# Patient Record
Sex: Female | Born: 1960 | Race: White | Hispanic: No | Marital: Single | Smoking: Never smoker
Health system: Southern US, Community
[De-identification: ages and names within clinical notes are randomized; demographics above are authoritative.]

## PROBLEM LIST (undated history)

## (undated) DIAGNOSIS — IMO0002 Reserved for concepts with insufficient information to code with codable children: Secondary | ICD-10-CM

## (undated) DIAGNOSIS — K589 Irritable bowel syndrome without diarrhea: Secondary | ICD-10-CM

## (undated) DIAGNOSIS — B009 Herpesviral infection, unspecified: Secondary | ICD-10-CM

## (undated) DIAGNOSIS — G522 Disorders of vagus nerve: Secondary | ICD-10-CM

## (undated) DIAGNOSIS — R87619 Unspecified abnormal cytological findings in specimens from cervix uteri: Secondary | ICD-10-CM

## (undated) DIAGNOSIS — R011 Cardiac murmur, unspecified: Secondary | ICD-10-CM

## (undated) DIAGNOSIS — Z9109 Other allergy status, other than to drugs and biological substances: Secondary | ICD-10-CM

## (undated) DIAGNOSIS — Z8739 Personal history of other diseases of the musculoskeletal system and connective tissue: Secondary | ICD-10-CM

## (undated) HISTORY — DX: Reserved for concepts with insufficient information to code with codable children: IMO0002

## (undated) HISTORY — DX: Irritable bowel syndrome, unspecified: K58.9

## (undated) HISTORY — PX: OTHER SURGICAL HISTORY: SHX169

## (undated) HISTORY — DX: Herpesviral infection, unspecified: B00.9

## (undated) HISTORY — DX: Personal history of other diseases of the musculoskeletal system and connective tissue: Z87.39

## (undated) HISTORY — DX: Disorders of vagus nerve: G52.2

## (undated) HISTORY — DX: Other allergy status, other than to drugs and biological substances: Z91.09

## (undated) HISTORY — DX: Unspecified abnormal cytological findings in specimens from cervix uteri: R87.619

## (undated) HISTORY — DX: Cardiac murmur, unspecified: R01.1

## (undated) HISTORY — PX: CHOLECYSTECTOMY: SHX55

---

## 1999-03-09 ENCOUNTER — Other Ambulatory Visit: Admission: RE | Admit: 1999-03-09 | Discharge: 1999-03-09 | Payer: Self-pay | Admitting: Obstetrics and Gynecology

## 2000-08-16 ENCOUNTER — Other Ambulatory Visit: Admission: RE | Admit: 2000-08-16 | Discharge: 2000-08-16 | Payer: Self-pay | Admitting: Obstetrics and Gynecology

## 2000-12-09 ENCOUNTER — Ambulatory Visit (HOSPITAL_COMMUNITY): Admission: RE | Admit: 2000-12-09 | Discharge: 2000-12-09 | Payer: Self-pay | Admitting: Orthopedic Surgery

## 2000-12-09 ENCOUNTER — Encounter: Payer: Self-pay | Admitting: Orthopedic Surgery

## 2001-05-09 ENCOUNTER — Encounter: Admission: RE | Admit: 2001-05-09 | Discharge: 2001-06-24 | Payer: Self-pay | Admitting: Orthopedic Surgery

## 2001-10-06 ENCOUNTER — Other Ambulatory Visit: Admission: RE | Admit: 2001-10-06 | Discharge: 2001-10-06 | Payer: Self-pay | Admitting: Obstetrics and Gynecology

## 2002-10-29 ENCOUNTER — Other Ambulatory Visit: Admission: RE | Admit: 2002-10-29 | Discharge: 2002-10-29 | Payer: Self-pay | Admitting: Obstetrics and Gynecology

## 2004-01-06 ENCOUNTER — Other Ambulatory Visit: Admission: RE | Admit: 2004-01-06 | Discharge: 2004-01-06 | Payer: Self-pay | Admitting: Obstetrics and Gynecology

## 2004-01-12 ENCOUNTER — Ambulatory Visit (HOSPITAL_COMMUNITY): Admission: RE | Admit: 2004-01-12 | Discharge: 2004-01-12 | Payer: Self-pay | Admitting: Obstetrics and Gynecology

## 2005-01-25 ENCOUNTER — Other Ambulatory Visit: Admission: RE | Admit: 2005-01-25 | Discharge: 2005-01-25 | Payer: Self-pay | Admitting: Urology

## 2005-04-11 ENCOUNTER — Encounter: Admission: RE | Admit: 2005-04-11 | Discharge: 2005-04-11 | Payer: Self-pay | Admitting: Family Medicine

## 2006-02-14 ENCOUNTER — Other Ambulatory Visit: Admission: RE | Admit: 2006-02-14 | Discharge: 2006-02-14 | Payer: Self-pay | Admitting: Obstetrics & Gynecology

## 2006-05-01 ENCOUNTER — Ambulatory Visit (HOSPITAL_COMMUNITY): Admission: RE | Admit: 2006-05-01 | Discharge: 2006-05-01 | Payer: Self-pay | Admitting: Obstetrics and Gynecology

## 2007-03-27 ENCOUNTER — Other Ambulatory Visit: Admission: RE | Admit: 2007-03-27 | Discharge: 2007-03-27 | Payer: Self-pay | Admitting: Obstetrics and Gynecology

## 2007-05-14 ENCOUNTER — Ambulatory Visit (HOSPITAL_COMMUNITY): Admission: RE | Admit: 2007-05-14 | Discharge: 2007-05-14 | Payer: Self-pay | Admitting: Obstetrics and Gynecology

## 2007-05-27 ENCOUNTER — Encounter: Admission: RE | Admit: 2007-05-27 | Discharge: 2007-05-27 | Payer: Self-pay | Admitting: Obstetrics and Gynecology

## 2008-03-09 ENCOUNTER — Encounter: Admission: RE | Admit: 2008-03-09 | Discharge: 2008-03-09 | Payer: Self-pay | Admitting: Gastroenterology

## 2008-05-26 ENCOUNTER — Other Ambulatory Visit: Admission: RE | Admit: 2008-05-26 | Discharge: 2008-05-26 | Payer: Self-pay | Admitting: Obstetrics & Gynecology

## 2008-08-23 ENCOUNTER — Ambulatory Visit (HOSPITAL_COMMUNITY): Admission: RE | Admit: 2008-08-23 | Discharge: 2008-08-23 | Payer: Self-pay | Admitting: Obstetrics and Gynecology

## 2010-01-18 ENCOUNTER — Encounter: Admission: RE | Admit: 2010-01-18 | Discharge: 2010-01-18 | Payer: Self-pay | Admitting: Obstetrics and Gynecology

## 2011-03-05 ENCOUNTER — Other Ambulatory Visit: Payer: Self-pay | Admitting: Obstetrics and Gynecology

## 2011-03-05 DIAGNOSIS — Z1231 Encounter for screening mammogram for malignant neoplasm of breast: Secondary | ICD-10-CM

## 2011-03-28 ENCOUNTER — Ambulatory Visit (HOSPITAL_COMMUNITY): Payer: Self-pay

## 2011-04-17 ENCOUNTER — Ambulatory Visit (HOSPITAL_COMMUNITY)
Admission: RE | Admit: 2011-04-17 | Discharge: 2011-04-17 | Disposition: A | Discharge status: Home / Self Care | Payer: BC Managed Care – PPO | Source: Ambulatory Visit | Attending: Obstetrics and Gynecology | Admitting: Obstetrics and Gynecology

## 2011-04-17 DIAGNOSIS — Z1231 Encounter for screening mammogram for malignant neoplasm of breast: Secondary | ICD-10-CM

## 2012-09-02 ENCOUNTER — Other Ambulatory Visit: Payer: Self-pay | Admitting: *Deleted

## 2012-09-02 DIAGNOSIS — Z780 Asymptomatic menopausal state: Secondary | ICD-10-CM

## 2012-09-10 ENCOUNTER — Encounter: Payer: Self-pay | Admitting: *Deleted

## 2012-09-12 ENCOUNTER — Ambulatory Visit: Payer: BC Managed Care – PPO

## 2012-09-12 DIAGNOSIS — Z780 Asymptomatic menopausal state: Secondary | ICD-10-CM

## 2012-09-12 LAB — FOLLICLE STIMULATING HORMONE: FSH: 40.6 m[IU]/mL

## 2012-09-23 ENCOUNTER — Telehealth: Payer: Self-pay | Admitting: Nurse Practitioner

## 2012-09-23 NOTE — Telephone Encounter (Signed)
Patient was called back, she had been OOT.  States she has some vaso symptoms, but for the most part are tolerable.  She had a withdraw bleed after stopping OCP in March and has not had menses since.  She is comfortable with watchful waiting and keep menses record.  If symptoms are bad can try OTC Estroven.  She is going OOT to trade show for next 3 months and after return will call back.  She might consider HRT and is instructed that she will need OV to discuss.

## 2014-11-25 ENCOUNTER — Ambulatory Visit (INDEPENDENT_AMBULATORY_CARE_PROVIDER_SITE_OTHER): Payer: BLUE CROSS/BLUE SHIELD | Admitting: Internal Medicine

## 2014-11-25 ENCOUNTER — Ambulatory Visit (INDEPENDENT_AMBULATORY_CARE_PROVIDER_SITE_OTHER)
Admission: RE | Admit: 2014-11-25 | Discharge: 2014-11-25 | Disposition: A | Discharge status: Home / Self Care | Payer: BLUE CROSS/BLUE SHIELD | Source: Ambulatory Visit | Attending: Internal Medicine | Admitting: Internal Medicine

## 2014-11-25 ENCOUNTER — Encounter: Payer: Self-pay | Admitting: Internal Medicine

## 2014-11-25 ENCOUNTER — Encounter (INDEPENDENT_AMBULATORY_CARE_PROVIDER_SITE_OTHER): Payer: Self-pay

## 2014-11-25 VITALS — BP 134/80 | HR 90 | Ht 63.0 in | Wt 185.0 lb

## 2014-11-25 DIAGNOSIS — J45991 Cough variant asthma: Secondary | ICD-10-CM

## 2014-11-25 DIAGNOSIS — R05 Cough: Secondary | ICD-10-CM

## 2014-11-25 DIAGNOSIS — R058 Other specified cough: Secondary | ICD-10-CM

## 2014-11-25 MED ORDER — TRAMADOL HCL 50 MG PO TABS: ORAL_TABLET | ORAL | Status: DC

## 2014-11-25 MED ORDER — FLUTTER DEVI: Status: DC

## 2014-11-25 NOTE — Patient Instructions (Addendum)
Stop advair and start new maintenance rx = dulera 100 Take 2 puffs first thing in am and then another 2 puffs about 12 hours later.   Work on Musician technique:  relax and gently blow all the way out then take a nice smooth deep breath back in, triggering the inhaler at same time you start breathing in.  Hold for up to 5 seconds if you can.  Rinse and gargle with water when done  For cough as needed  mucinex dm 1200 mg every 12 hours and flutter valve  - if still still coughing then add tramadol 50 mg 1-2 hours every 12 hours   For breathing as needed > Only use your albuterol as a rescue medication to be used if you can't catch your breath by resting or doing a relaxed purse lip breathing pattern.  - The less you use it, the better it will work when you need it. - Ok to use up to 2 puffs  every 4 hours if you must but call for immediate appointment if use goes up over your usual need - Don't leave home without it !!  (think of it like the spare tire for your car)   Try prilosec 20mg   Take 30-60 min before first meal of the day and Pepcid (famotidine) 20 mg one bedtime until cough is completely gone for at least a week without the need for cough suppression then stop it     GERD (REFLUX)  is an extremely common cause of respiratory symptoms just like yours , many times with no obvious heartburn at all.    It can be treated with medication, but also with lifestyle changes including avoidance of late meals, elevation of the head of your bed (ideally with 6 inch  bed blocks) excessive alcohol, smoking cessation, and avoid fatty foods, chocolate, peppermint, colas, red wine, and acidic juices such as orange juice.  NO MINT OR MENTHOL PRODUCTS SO NO COUGH DROPS  USE SUGARLESS CANDY INSTEAD (Jolley ranchers or Stover's or Life Savers) or even ice chips will also do - the key is to swallow to prevent all throat clearing. NO OIL BASED VITAMINS - use powdered substitutes.   Please see  patient coordinator before you leave today  to schedule sinus ct  Please remember to go to the  x-ray department downstairs for your tests - we will call you with the results when they are available.  Please schedule a follow up office visit in 2 weeks, sooner if needed

## 2014-11-25 NOTE — Progress Notes (Signed)
Subjective:    Patient ID: Cassidy Walton, female    DOB: [birth year], 54 y.o.   MRN: [id number]  HPI  37 yowf never regular smoker with fall = spring allergies eval by Dr [name] at this clinic mold rec shots declined and just used otc's then asthma problems mid 30's rx with prn saba rarely needed but seemed to trigger with sinus problems that recurred early May 2016 and using every 4 hours since despite rx with zpak/ pred so referred by [name] to pulmonary clinic p newly started on Advair 11/22/14 plus prednisone   11/25/2014 1st [facility] office visit/ Oliverio Cho   Chief Complaint  Patient presents with  . Pulmonary Consult    Referred by Dr. [name]. Pt states dxed with Asthma over 20 yrs ago.  She c/o cough for the past 3-4 wks. Cough was prod a few wks ago, but not for the past wk or so. Cough is much worse when she lies down- has to sleep sitting up.  She is using albuterol approx 6 x per day.   this am took advair and last dose of albterol 6 h prior to ov/ best she's felt was p [facility] rx 6/8 with neb  All this started with sinus flare "like it always dose"  Cough >> sob and coughs so hard hurts over abd/ occ urinary incont   No obvious  patterns in day to day or daytime variabilty or assoc  cp or chest tightness, subjective wheeze overt sinus or hb symptoms. No unusual exp hx or h/o childhood pna/ asthma or knowledge of premature birth.   . Also denies any obvious fluctuation of symptoms with weather or environmental changes or other aggravating or alleviating factors except as outlined above   Current Medications, Allergies, Complete Past Medical History, Past Surgical History, Family History, and Social History were reviewed in [employer] record.             Review of Systems  Constitutional: Negative for fever, chills and unexpected weight change.  HENT: Negative for congestion, dental problem, ear pain, nosebleeds, postnasal drip, rhinorrhea, sinus  pressure, sneezing, sore throat, trouble swallowing and voice change.   Eyes: Negative for visual disturbance.  Respiratory: Positive for cough. Negative for choking and shortness of breath.   Cardiovascular: Negative for chest pain and leg swelling.  Gastrointestinal: Positive for abdominal pain. Negative for vomiting and diarrhea.  Genitourinary: Negative for difficulty urinating.  Musculoskeletal: Negative for arthralgias.  Skin: Negative for rash.  Neurological: Positive for headaches. Negative for tremors and syncope.  Hematological: Does not bruise/bleed easily.       Objective:   Physical Exam  amb wf with mild pseudowheeze /very talkative/ nad  Wt Readings from Last 3 Encounters:  11/25/14 185 lb (83.915 kg)    Vital signs reviewed     HEENT: nl dentition, turbinates, and orophanx. Nl external ear canals without cough reflex   NECK :  without JVD/Nodes/TM/ nl carotid upstrokes bilaterally   LUNGS: no acc muscle use, clear to A and P bilaterally without cough on insp or exp maneuvers   CV:  RRR  no s3 or murmur or increase in P2, no edema   ABD:  soft and nontender with nl excursion in the supine position. No bruits or organomegaly, bowel sounds nl  MS:  warm without deformities, calf tenderness, cyanosis or clubbing  SKIN: warm and dry without lesions    NEURO:  alert, approp, no deficits  CXR PA and Lateral:   11/25/2014 :     I personally reviewed images and agree with radiology impression as follows:     Mild/mod kyphosis, no acute changes      Assessment & Plan:

## 2014-11-26 ENCOUNTER — Encounter: Payer: Self-pay | Admitting: Internal Medicine

## 2014-11-26 DIAGNOSIS — J45991 Cough variant asthma: Secondary | ICD-10-CM | POA: Insufficient documentation

## 2014-11-26 NOTE — Progress Notes (Signed)
Quick Note:  Spoke with pt and notified of results per Dr. [name]. Pt verbalized understanding and denied any questions.  ______ 

## 2014-11-26 NOTE — Assessment & Plan Note (Signed)
The most common causes of chronic cough in immunocompetent adults include the following: upper airway cough syndrome (UACS), previously referred to as postnasal drip syndrome (PNDS), which is caused by variety of rhinosinus conditions; (2) asthma; (3) GERD; (4) chronic bronchitis from cigarette smoking or other inhaled environmental irritants; (5) nonasthmatic eosinophilic bronchitis; and (6) bronchiectasis.   These conditions, singly or in combination, have accounted for up to 94% of the causes of chronic cough in prospective studies.   Other conditions have constituted no >6% of the causes in prospective studies These have included bronchogenic carcinoma, chronic interstitial pneumonia, sarcoidosis, left ventricular failure, ACEI-induced cough, and aspiration from a condition associated with pharyngeal dysfunction.    Chronic cough is often simultaneously caused by more than one condition. A single cause has been found from 38 to 82% of the time, multiple causes from 18 to 62%. Multiply caused cough has been the result of three diseases up to 42% of the time.       Based on hx and exam, this is most likely:  Classic Upper airway cough syndrome, so named because it's frequently impossible to sort out how much is  CR/sinusitis with freq throat clearing (which can be related to primary GERD)   vs  causing  secondary (" extra esophageal")  GERD from wide swings in gastric pressure that occur with throat clearing, often  promoting self use of mint and menthol lozenges that reduce the lower esophageal sphincter tone and exacerbate the problem further in a cyclical fashion.   These are the same pts (now being labeled as having "irritable larynx syndrome" by some cough centers) who not infrequently have a history of having failed to tolerate ace inhibitors,  dry powder inhalers or biphosphonates or report having atypical reflux symptoms that don't respond to standard doses of PPI , and are easily confused as  having aecopd or asthma flares by even experienced allergists/ pulmonologists.   The first step is to maximize acid suppression and eliminate cyclical coughing  Plus eval for underlying sinus dz then regroup.  See instructions for specific recommendations which were reviewed directly with the patient who was given a copy with highlighter outlining the key components.

## 2014-11-26 NOTE — Assessment & Plan Note (Signed)
Very difficult to tell how much of her problem is asthma though cough have either a sinobronchial reflex or a reflex to reflux either one driving reactive airwasy dz and that's why she felt better p neb and prednisone  rec trial of dulera 100 2bid instead of dpi advair as she has a large component of uacs active here  The proper method of use, as well as anticipated side effects, of a metered-dose inhaler are discussed and demonstrated to the patient. Improved effectiveness after extensive coaching during this visit to a level of approximately  75%

## 2014-12-01 ENCOUNTER — Ambulatory Visit (INDEPENDENT_AMBULATORY_CARE_PROVIDER_SITE_OTHER)
Admission: RE | Admit: 2014-12-01 | Discharge: 2014-12-01 | Disposition: A | Discharge status: Home / Self Care | Payer: BLUE CROSS/BLUE SHIELD | Source: Ambulatory Visit | Attending: Internal Medicine | Admitting: Internal Medicine

## 2014-12-01 DIAGNOSIS — R05 Cough: Secondary | ICD-10-CM

## 2014-12-01 DIAGNOSIS — R058 Other specified cough: Secondary | ICD-10-CM

## 2014-12-01 NOTE — Progress Notes (Signed)
Quick Note:  LVM for patient to return call. ______ 

## 2014-12-01 NOTE — Progress Notes (Signed)
Quick Note:  Pt returned call. Reviewed results and recs. Pt aware of cough plan per last OV. Pt voiced understanding and had no further questions. ______

## 2014-12-10 ENCOUNTER — Encounter: Payer: Self-pay | Admitting: Internal Medicine

## 2014-12-10 ENCOUNTER — Ambulatory Visit (INDEPENDENT_AMBULATORY_CARE_PROVIDER_SITE_OTHER): Payer: BLUE CROSS/BLUE SHIELD | Admitting: Internal Medicine

## 2014-12-10 VITALS — BP 130/90 | HR 72 | Ht 60.0 in | Wt 186.0 lb

## 2014-12-10 DIAGNOSIS — J45991 Cough variant asthma: Secondary | ICD-10-CM

## 2014-12-10 DIAGNOSIS — R05 Cough: Secondary | ICD-10-CM

## 2014-12-10 DIAGNOSIS — R058 Other specified cough: Secondary | ICD-10-CM

## 2014-12-10 MED ORDER — MOMETASONE FURO-FORMOTEROL FUM 100-5 MCG/ACT IN AERO: 2.0000 | INHALATION_SPRAY | Freq: Two times a day (BID) | RESPIRATORY_TRACT | Status: DC

## 2014-12-10 NOTE — Progress Notes (Signed)
Subjective:    Patient ID: Cassidy Walton, female    DOB: 11-30-1960   MRN: [id number]    Brief patient profile:  62 yowf never regular smoker with fall = spring allergies eval by Dr [name] at this clinic mold rec shots declined and just used otc's then asthma problems mid 30's rx with prn saba rarely needed but seemed to trigger with sinus problems that recurred early May 2016 and using every 4 hours since despite rx with zpak/ pred so referred by [name] to pulmonary clinic p newly started on Advair 11/22/14 plus prednisone   History of Present Illness  11/25/2014 1st [facility] office visit/ Cassidy Walton   Chief Complaint  Patient presents with  . Pulmonary Consult    Referred by Dr. [name]. Pt states dxed with Asthma over 20 yrs ago.  She c/o cough for the past 3-4 wks. Cough was prod a few wks ago, but not for the past wk or so. Cough is much worse when she lies down- has to sleep sitting up.  She is using albuterol approx 6 x per day.   this am took advair and last dose of albterol 6 h prior to ov/ best she's felt was p [facility] rx 6/8 with neb  All this started with sinus flare "like it always dose"  Cough >> sob and coughs so hard hurts over abd/ occ urinary incont  rec Stop advair and start new maintenance rx = dulera 100 Take 2 puffs first thing in am and then another 2 puffs about 12 hours later.  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ist, commercial:   For cough as needed  mucinex dm 1200 mg every 12 hours and flutter valve  - if still still coughing then add tramadol 50 mg 1-2 hours every 12 hours For breathing as needed > Only use your albuterol as a rescue medication  Try prilosec 20mg   Take 30-60 min before first meal of the day and Pepcid (famotidine) 20 mg one bedtime until cough is completely gone for at least a week without the need for cough suppression then stop it  GERD diet   schedule sinus ct> neg   12/10/2014 f/u ov/Cassidy Walton re: severe cough / maint on gerdr rx adn dulera 1002 bid/  not sure how to use flutter Chief Complaint  Patient presents with  . Follow-up    Cough is much improved since her last visit.     Little hacking cough spring / fall all her life and feels back to baseline/ seems worse first thing in am  No obvious day to day or daytime variabilty or assoc sob or cp or chest tightness, subjective wheeze overt sinus or hb symptoms. No unusual exp hx or h/o childhood pna/ asthma or knowledge of premature birth.  Sleeping ok without nocturnal  or early am exacerbation  of respiratory  c/o's or need for noct saba. Also denies any obvious fluctuation of symptoms with weather or environmental changes or other aggravating or alleviating factors except as outlined above   Current Medications, Allergies, Complete Past Medical History, Past Surgical History, Family History, and Social History were reviewed in [employer] record.  ROS  The following are not active complaints unless bolded sore throat, dysphagia, dental problems, itching, sneezing,  nasal congestion or excess/ purulent secretions, ear ache,   fever, chills, sweats, unintended wt loss, pleuritic or exertional cp, hemoptysis,  orthopnea pnd or leg swelling, presyncope, palpitations, abdominal pain, anorexia, nausea, vomiting, diarrhea  or change in bowel or urinary habits, change in stools or urine, dysuria,hematuria,  rash, arthralgias, visual complaints, headache, numbness weakness or ataxia or problems with walking or coordination,  change in mood/affect or memory.            Objective:   Physical Exam  amb wf with nad    . Wt Readings from Last 3 Encounters:  12/10/14 186 lb (84.369 kg)  11/25/14 185 lb (83.915 kg)    Vital signs reviewed      HEENT: nl dentition, turbinates, and orophanx. Nl external ear canals without cough reflex   NECK :  without JVD/Nodes/TM/ nl carotid upstrokes bilaterally   LUNGS: no acc muscle use, clear to A and P bilaterally without  cough on insp or exp maneuvers   CV:  RRR  no s3 or murmur or increase in P2, no edema   ABD:  soft and nontender with nl excursion in the supine position. No bruits or organomegaly, bowel sounds nl  MS:  warm without deformities, calf tenderness, cyanosis or clubbing  SKIN: warm and dry without lesions    NEURO:  alert, approp, no deficits     CXR PA and Lateral:   11/25/2014 :     I personally reviewed images and agree with radiology impression as follows:     Mild/mod kyphosis, no acute changes      Assessment & Plan:

## 2014-12-10 NOTE — Patient Instructions (Addendum)
Try to leave off the tramadol  Continue dulera 100 Take 2 puffs first thing in am and then another 2 puffs about 12 hours later  Continue  prilosec 20mg   Take 30-60 min before first meal of the day and Pepcid (famotidine) 20 mg one bedtime and chlortrimeton 4 mg 1-2 at bedtime to see what effect it has on your hacky am cough   For drainage take chlortrimeton (chlorpheniramine) 4 mg every 4 hours available over the counter (may cause drowsiness)   Please schedule a follow up office visit in 4 weeks, sooner if needed

## 2014-12-12 ENCOUNTER — Encounter: Payer: Self-pay | Admitting: Internal Medicine

## 2014-12-12 NOTE — Assessment & Plan Note (Signed)
-  11/25/2014 p extensive coaching HFA effectiveness =    75% > try dulera 100 and stop advair   All goals of chronic asthma control met including optimal function and elimination of symptoms with minimal need for rescue therapy.  Contingencies discussed in full including contacting this office immediately if not controlling the symptoms using the rule of two's.

## 2014-12-12 NOTE — Assessment & Plan Note (Signed)
-   sinus ct 12/01/2014 > Mild mucosal edema in the maxillary sinus bilaterally. Remaining sinuses are clear. No air-fluid level  Marked improvement off advair and on gerd rx - challenge will be to see if cough cycles back up in intensity since not completely gone "all her life"  rec trial of add on 1st gen H1 per guidelines  I had an extended discussion with the patient reviewing all relevant studies completed to date and  lasting 15 to 20 minutes of a 25 minute visit on the following ongoing concerns: The standardized cough guidelines published in Chest by [place] in 2006 are still the best available and consist of a multiple step process (up to 12!) , not a single office visit,  and are intended  to address this problem logically,  with an alogrithm dependent on response to empiric treatment at  each progressive step  to determine a specific diagnosis with  minimal addtional testing needed. Therefore if adherence is an issue or can't be accurately verified,  it's very unlikely the standard evaluation and treatment will be successful here.   Each maintenance medication was reviewed in detail including most importantly the difference between maintenance and as needed and under what circumstances the prns are to be used.  Please see instructions for details which were reviewed in writing and the patient given a copy.     Furthermore, response to therapy (other than acute cough suppression, which should only be used short term with avoidance of narcotic containing cough syrups if possible), can be a gradual process for which the patient may perceive immediate benefit.  Unlike going to an eye doctor where the best perscription is almost always the first one and is immediately effective, this is almost never the case in the management of chronic cough syndromes. Therefore the patient needs to commit up front to consistently adhere to recommendations  for up to 6 weeks of therapy directed at the likely  underlying problem(s) before the response can be reasonably evaluated.

## 2015-01-04 ENCOUNTER — Telehealth: Payer: Self-pay | Admitting: Internal Medicine

## 2015-01-04 NOTE — Telephone Encounter (Signed)
Patient seen in office 12/10/14 - change in regimen from Mucinex BID to Chlortrimeton q4hr Patient states that she is taking all other meds as directed (Dulera, Prilosec, and Pepcid) Pt states that she has started back on Mucinex BID d/t increased cough and congestion - pt has been working outside with her job this week and states that the weather has got her breathing upset. Wants to know if she needs to be taking the Mucinex and Chlortrimeton together? Is it safe to take Mucinex BID and in addtion take Chlortrimeton q4hr? Patient Instructions 12/10/14    Try to leave off the tramadol  Continue dulera 100 Take 2 puffs first thing in am and then another 2 puffs about 12 hours later  Continue prilosec 20mg  Take 30-60 min before first meal of the day and Pepcid (famotidine) 20 mg one bedtime and chlortrimeton 4 mg 1-2 at bedtime to see what effect it has on your hacky am cough   For drainage take chlortrimeton (chlorpheniramine) 4 mg every 4 hours available over the counter (may cause drowsiness)   Please schedule a follow up office visit in 4 weeks, sooner if needed    Please advise Dr Sherene SiresWert. Thanks.

## 2015-01-04 NOTE — Telephone Encounter (Signed)
Called made pt aware of recs.nothing further needed

## 2015-01-04 NOTE — Telephone Encounter (Signed)
For drainage take chlortrimeton (chlorpheniramine) 4 mg every 4 hours available over the counter (may cause drowsiness)   For cough take mucinex dm up to 1200 mg every 12 hours and if not better ov with Tammy or me with all meds including otcs in hand

## 2015-01-12 ENCOUNTER — Ambulatory Visit (INDEPENDENT_AMBULATORY_CARE_PROVIDER_SITE_OTHER): Payer: BLUE CROSS/BLUE SHIELD | Admitting: Internal Medicine

## 2015-01-12 ENCOUNTER — Other Ambulatory Visit (INDEPENDENT_AMBULATORY_CARE_PROVIDER_SITE_OTHER): Payer: BLUE CROSS/BLUE SHIELD

## 2015-01-12 ENCOUNTER — Encounter: Payer: Self-pay | Admitting: Internal Medicine

## 2015-01-12 VITALS — BP 150/70 | HR 88 | Wt 184.0 lb

## 2015-01-12 DIAGNOSIS — J45991 Cough variant asthma: Secondary | ICD-10-CM

## 2015-01-12 DIAGNOSIS — R058 Other specified cough: Secondary | ICD-10-CM

## 2015-01-12 DIAGNOSIS — R05 Cough: Secondary | ICD-10-CM

## 2015-01-12 LAB — CBC WITH DIFFERENTIAL/PLATELET
BASOS ABS: 0 10*3/uL (ref 0.0–0.1)
BASOS PCT: 0.3 % (ref 0.0–3.0)
EOS PCT: 8.5 % — AB (ref 0.0–5.0)
Eosinophils Absolute: 0.7 10*3/uL (ref 0.0–0.7)
HEMATOCRIT: 42.6 % (ref 36.0–46.0)
Hemoglobin: 14.3 g/dL (ref 12.0–15.0)
Lymphocytes Relative: 23.8 % (ref 12.0–46.0)
Lymphs Abs: 1.9 10*3/uL (ref 0.7–4.0)
MCHC: 33.6 g/dL (ref 30.0–36.0)
MCV: 90 fl (ref 78.0–100.0)
MONO ABS: 0.5 10*3/uL (ref 0.1–1.0)
MONOS PCT: 6.7 % (ref 3.0–12.0)
Neutro Abs: 4.9 10*3/uL (ref 1.4–7.7)
Neutrophils Relative %: 60.7 % (ref 43.0–77.0)
Platelets: 216 10*3/uL (ref 150.0–400.0)
RBC: 4.74 Mil/uL (ref 3.87–5.11)
RDW: 13.3 % (ref 11.5–15.5)
WBC: 8.1 10*3/uL (ref 4.0–10.5)

## 2015-01-12 NOTE — Patient Instructions (Addendum)
Dulera 100 Take 2 puffs first thing in am and then another 2 puffs about 12 hours later.   Try prilosec otc   Take 30-60 min before first meal of the day and Pepcid ac (famotidine) 20 mg one @  bedtime until cough is completely gone for at least a week without the need for cough suppression and your voice is completely normal   Only use your albuterol as a rescue medication to be used up to every 4 hours  If needed for cough/ wheeze/ short of breath  - The less you use it, the better it will work when you need it.  - Don't leave home without it !!  (think of it like the spare tire for your car)   For drainage take chlortrimeton (chlorpheniramine) 4 mg every 4 hours available over the counter (may cause drowsiness)   For cough mucinex dm up to 1200 mg every 12 hours as needed and use the flutter valve to prevent airway trauma  Please remember to go to the lab department downstairs for your tests - we will call you with the results when they are available.  If not doing well as you desire > See Tammy NP w/in 2 weeks with all your medications, even over the counter meds, and flutter valve/ separated in two separate bags, the ones you take no matter what vs the ones you stop once you feel better and take only as needed when you feel you need them.   Tammy  will generate for you a new user friendly medication calendar that will put [place] all on the same page re: your medication use.     Without this process, it simply isn't possible to assure that we are providing  your outpatient care  with  the attention to detail we feel you deserve.   If we cannot assure that you're getting that kind of care,  then we cannot manage your problem effectively from this clinic.  Once you have seen Tammy and we are sure that we're all on the same page with your medication use she will arrange follow up with me.

## 2015-01-12 NOTE — Progress Notes (Signed)
Subjective:    Patient ID: Cassidy Walton, female    DOB: [birth year]   MRN: [id number]    Brief patient profile:  53 yowf never regular smoker with fall = spring allergies eval by Dr [name] at this clinic mold rec shots declined and just used otc's then asthma problems mid 30's rx with prn saba rarely needed but seemed to trigger with sinus problems that recurred early May 2016 and using every 4 hours since despite rx with zpak/ pred so referred by [name] to pulmonary clinic p newly started on Advair 11/22/14 plus prednisone   History of Present Illness  11/25/2014 1st [facility] office visit/ [name]   Chief Complaint  Patient presents with  . Pulmonary Consult    Referred by Dr. [name]. Pt states dxed with Asthma over 20 yrs ago.  She c/o cough for the past 3-4 wks. Cough was prod a few wks ago, but not for the past wk or so. Cough is much worse when she lies down- has to sleep sitting up.  She is using albuterol approx 6 x per day.   this am took advair and last dose of albterol 6 h prior to ov/ best she's felt was p Gates rx 6/8 with neb  All this started with sinus flare "like it always dose"  Cough >> sob and coughs so hard hurts over abd/ occ urinary incont  rec Stop advair and start new maintenance rx = dulera 100 Take 2 puffs first thing in am and then another 2 puffs about 12 hours later.  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, commercial:   For cough as needed  mucinex dm 1200 mg every 12 hours and flutter valve  - if still still coughing then add tramadol 50 mg 1-2 hours every 12 hours For breathing as needed > Only use your albuterol as a rescue medication  Try prilosec   Take 30-60 min before first meal of the day and Pepcid (famotidine) 20 mg one bedtime until cough is completely gone for at least a week without the need for cough suppression then stop it  GERD diet   schedule sinus ct> neg   12/10/2014 f/u ov/[name] re: severe cough / maint on gerdr rx and dulera 100 2 bid/  not sure how to use flutter Chief Complaint  Patient presents with  . Follow-up    Cough is much improved since her last visit.    Little hacking cough spring / fall all her life and feels back to baseline/ seems worse first thing in am rec Try to leave off the tramadol Continue dulera 100 Take 2 puffs first thing in am and then another 2 puffs about 12 hours later Continue  prilosec   Take 30-60 min before first meal of the day and Pepcid (famotidine) 20 mg one bedtime and chlortrimeton 4 mg 1-2 at bedtime to see what effect it has on your hacky am cough  For drainage take chlortrimeton (chlorpheniramine) 4 mg every 4 hours available over the counter (may cause drowsiness)    01/12/2015 f/u ov/[name] re: recurrent cough  Chief Complaint  Patient presents with  . Follow-up    Pt states her cough has been worse for the past 2 wks- relates to working outside in the heat. Cough is prod with tan sputum.      Was better, now worse with cough more day than noct, more outdoors than indoors / not really clear what / how she takes her  meds   No obvious day to day or daytime variabilty or assoc sob or cp or chest tightness, subjective wheeze overt sinus or hb symptoms. No unusual exp hx or h/o childhood pna/ asthma or knowledge of premature birth.  Sleeping ok without nocturnal  or early am exacerbation  of respiratory  c/o's or need for noct saba. Also denies any obvious fluctuation of symptoms with weather or environmental changes or other aggravating or alleviating factors except as outlined above   Current Medications, Allergies, Complete Past Medical History, Past Surgical History, Family History, and Social History were reviewed in [employer] record.  ROS  The following are not active complaints unless bolded sore throat, dysphagia, dental problems, itching, sneezing,  nasal congestion or excess/ purulent secretions, ear ache,   fever, chills, sweats, unintended wt  loss, pleuritic or exertional cp, hemoptysis,  orthopnea pnd or leg swelling, presyncope, palpitations, abdominal pain, anorexia, nausea, vomiting, diarrhea  or change in bowel or urinary habits, change in stools or urine, dysuria,hematuria,  rash, arthralgias, visual complaints, headache, numbness weakness or ataxia or problems with walking or coordination,  change in mood/affect or memory.            Objective:   Physical Exam  amb wf with nad - slt voice fatigue    01/12/2015        184  Wt Readings from Last 3 Encounters:  12/10/14 186 lb (84.369 kg)  11/25/14 185 lb (83.915 kg)    Vital signs reviewed      HEENT: nl dentition, turbinates, and orophanx. Nl external ear canals without cough reflex   NECK :  without JVD/Nodes/TM/ nl carotid upstrokes bilaterally   LUNGS: no acc muscle use, clear to A and P bilaterally without cough on insp or exp maneuvers   CV:  RRR  no s3 or murmur or increase in P2, no edema   ABD:  soft and nontender with nl excursion in the supine position. No bruits or organomegaly, bowel sounds nl  MS:  warm without deformities, calf tenderness, cyanosis or clubbing  SKIN: warm and dry without lesions    NEURO:  alert, approp, no deficits     CXR PA and Lateral:   11/25/2014 :     I personally reviewed images and agree with radiology impression as follows:    Mild/mod kyphosis, no acute changes    Labs ordered 01/12/15 allergy profile/ cbc with diff      Assessment & Plan:

## 2015-01-13 LAB — ALLERGY FULL PROFILE
ALLERGEN, D PTERNOYSSINUS, D1: 0.14 kU/L — AB
Allergen,Goose feathers, e70: <0.1 kU/L
Alternaria Alternata: <0.1 kU/L
Aspergillus fumigatus, m3: <0.1 kU/L
Bermuda Grass: <0.1 kU/L
Box Elder IgE: <0.1 kU/L
Candida Albicans: <0.1 kU/L
Cat Dander: <0.1 kU/L
Curvularia lunata: <0.1 kU/L
D. farinae: 0.12 kU/L — ABNORMAL HIGH
Elm IgE: <0.1 kU/L
Fescue: <0.1 kU/L
G005 Rye, Perennial: <0.1 kU/L
Helminthosporium halodes: <0.1 kU/L
IgE (Immunoglobulin E), Serum: 27 kU/L (ref <115)
Lamb's Quarters: <0.1 kU/L
Plantain: <0.1 kU/L
Sycamore Tree: <0.1 kU/L

## 2015-01-14 ENCOUNTER — Telehealth: Payer: Self-pay | Admitting: *Deleted

## 2015-01-14 NOTE — Telephone Encounter (Signed)
-----   Message from Nyoka Cowden, MD sent at 01/13/2015  7:59 PM EDT -----   ----- Message -----    From: Nyoka Cowden, MD    Sent: 01/13/2015   7:56 PM      To: Nyoka Cowden, MD  I made error on result and don't know how to ammend it but it should say mild dust, not mold allergy

## 2015-01-14 NOTE — Telephone Encounter (Signed)
Spoke with pt and notified of results per Dr. [name]. Pt verbalized understanding and denied any questions. 

## 2015-01-14 NOTE — Progress Notes (Signed)
Quick Note:  See phone note dated 01/14/15 ______

## 2015-01-18 ENCOUNTER — Encounter: Payer: Self-pay | Admitting: Internal Medicine

## 2015-01-18 NOTE — Assessment & Plan Note (Signed)
-   11/25/2014 p extensive coaching HFA effectiveness =    75% > try dulera 100 and stop advair   The proper method of use, as well as anticipated side effects, of a metered-dose inhaler are discussed and demonstrated to the patient. Improved effectiveness after extensive coaching during this visit to a level of approximately  75% but no better  dulera is probably the best choice to avoid aggravating uacs but needs to first perfect optimal hfa technique  I had an extended discussion with the patient reviewing all relevant studies completed to date and  lasting 15 to 20 minutes of a 25 minute visit    Each maintenance medication was reviewed in detail including most importantly the difference between maintenance and prns and under what circumstances the prns are to be triggered using an action plan format that is not reflected in the computer generated alphabetically organized AVS.    Please see instructions for details which were reviewed in writing and the patient given a copy highlighting the part that I personally wrote and discussed at today's ov.

## 2015-01-18 NOTE — Assessment & Plan Note (Signed)
-   sinus ct 12/01/2014 > Mild mucosal edema in the maxillary sinus bilaterally. Remaining sinuses are clear. No air-fluid level - Allergy profile 01/12/15 > Eos 0.7/  IgE 27, min pos mold on RAST  Says this is a lifelong problem so unlikely to completely eliminate it at this point.  However, if she wants to work with [place] she'll have to meet [place] half way and understand:  Unlike when you get a prescription for eyeglasses, it's not possible to always walk out of this or any medical office with a perfect prescription that is immediately effective  based on any test that we offer here.    On the contrary, it may take several weeks for the full impact of changes recommened today - hopefully you will respond well.  If not, then we'll adjust your medication on your next visit accordingly, knowing more then than we can possibly know now.      First step then is to try implementing rx as prev rec but  return for full med reconciliation and go from there.

## 2015-01-25 ENCOUNTER — Telehealth: Payer: Self-pay | Admitting: Internal Medicine

## 2015-01-25 NOTE — Telephone Encounter (Signed)
Fine with me to f/u prn but if needs to return to see me needs to bring all active meds with her to confirm what it is she is actively taking before considering any change in rx

## 2015-01-25 NOTE — Telephone Encounter (Signed)
Patient notified.  Nothing further needed. 

## 2015-01-25 NOTE — Telephone Encounter (Signed)
Patient still has some cough, not as bad as it was.  Patient is still using flutter device daily.  Patient does not understand why she needs to come in to have a medication calendar done.     Patient says that she is taking all the medication that she was told to take. The only medication she is not taking is the Mucinex but she keeps it on hand if she needs it. Patient is leaving to go out of town on 8/16 and will be gone for 3 weeks.  FYI to Dr. Sherene Sires

## 2015-03-02 ENCOUNTER — Telehealth: Payer: Self-pay | Admitting: Internal Medicine

## 2015-03-02 NOTE — Telephone Encounter (Signed)
Received PA form from [facility] at [phone] Pt ID number [id number]  Was advised to call [phone]- long hold time will have to try again later

## 2015-03-02 NOTE — Telephone Encounter (Signed)
Spoke with the pt  She states needing PA done on Dulera  [facility] called [facility] [place] and req fax  Will hold in my basket

## 2015-03-03 NOTE — Telephone Encounter (Signed)
MW pt needs PA on dulera.  Pt last seen 01/12/15 and told to f/u with TP for med calendar then MW--not done. Do you want to continue with PA?

## 2015-03-03 NOTE — Telephone Encounter (Signed)
Attempted to contact patient, line busy. wcb 

## 2015-03-03 NOTE — Telephone Encounter (Signed)
Given her a 2 week sample and do the f/u as planned as we can probably get her something better then (symbicort has a 0 copay guarantee for example)

## 2015-03-04 NOTE — Telephone Encounter (Signed)
lmtcb

## 2015-03-07 MED ORDER — MOMETASONE FURO-FORMOTEROL FUM 100-5 MCG/ACT IN AERO: 2.0000 | INHALATION_SPRAY | Freq: Two times a day (BID) | RESPIRATORY_TRACT | Status: DC

## 2015-03-07 NOTE — Telephone Encounter (Signed)
lmtcb X2 for pt to make her aware of below recs.   Samples of dulera placed up front for pt.

## 2015-03-07 NOTE — Telephone Encounter (Signed)
Pt returned call Pt can not understand any messages on home phone. If pt needs an appt, please call and leave that on VM. [phone] or call [phone] (home)

## 2015-03-07 NOTE — Telephone Encounter (Signed)
Cassidy Walton from [facility] returned call about [place] PA [phone]

## 2015-03-07 NOTE — Telephone Encounter (Signed)
Spoke with pt. She agrees with the sample and ROV. Sample has been left at the front desk. ROV has been scheduled for 03/22/15 at 9:45am. Nothing further was needed at this time.

## 2015-03-07 NOTE — Telephone Encounter (Signed)
Spoke with Brayton Caves at [facility] and informed that we have not processed the [facility] PA yet because we are trying to contact pt regarding sample and setting up f/u.  Brayton Caves states he will keep PA on hold for now. Will await call back from pt.

## 2015-03-22 ENCOUNTER — Encounter: Payer: Self-pay | Admitting: Internal Medicine

## 2015-03-22 ENCOUNTER — Ambulatory Visit (INDEPENDENT_AMBULATORY_CARE_PROVIDER_SITE_OTHER): Payer: BLUE CROSS/BLUE SHIELD | Admitting: Internal Medicine

## 2015-03-22 VITALS — BP 142/90 | HR 78 | Ht 62.75 in | Wt 184.4 lb

## 2015-03-22 DIAGNOSIS — J45991 Cough variant asthma: Secondary | ICD-10-CM | POA: Diagnosis not present

## 2015-03-22 DIAGNOSIS — E669 Obesity, unspecified: Secondary | ICD-10-CM | POA: Diagnosis not present

## 2015-03-22 DIAGNOSIS — R05 Cough: Secondary | ICD-10-CM

## 2015-03-22 DIAGNOSIS — R058 Other specified cough: Secondary | ICD-10-CM

## 2015-03-22 MED ORDER — FLUTICASONE-SALMETEROL 115-21 MCG/ACT IN AERO: 2.0000 | INHALATION_SPRAY | Freq: Two times a day (BID) | RESPIRATORY_TRACT | Status: DC

## 2015-03-22 NOTE — Progress Notes (Addendum)
Subjective:    Patient ID: Cassidy Walton, female    DOB: June 12, 1961   MRN: [id number]    Brief patient profile:  [age] yowf never regular smoker with fall = spring allergies eval by Dr Nira RetortGene at this clinic mold rec shots declined and just used otc's then asthma problems mid 30's rx with prn saba rarely needed but seemed to trigger with sinus problems that recurred early May 2016 and using every 4 hours since despite rx with zpak/ pred so referred by [name] to pulmonary clinic p newly started on Advair 11/22/14 plus prednisone   History of Present Illness  11/25/2014 1st [facility] office visit/ [name]   Chief Complaint  Patient presents with  . Pulmonary Consult    Referred by Dr. [name]. Pt states dxed with Asthma over 20 yrs ago.  She c/o cough for the past 3-4 wks. Cough was prod a few wks ago, but not for the past wk or so. Cough is much worse when she lies down- has to sleep sitting up.  She is using albuterol approx 6 x per day.   this am took advair and last dose of albterol 6 h prior to ov/ best she's felt was p [facility] rx 6/8 with neb  All this started with sinus flare "like it always dose"  Cough >> sob and coughs so hard hurts over abd/ occ urinary incont  rec Stop advair and start new maintenance rx = dulera 100 Take 2 puffs first thing in am and then another 2 puffs about 12 hours later.  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[employer], commercialperfecting  inhaler technique:   For cough as needed  mucinex dm 1200 mg every 12 hours and flutter valve  - if still still coughing then add tramadol 50 mg 1-2 hours every 12 hours For breathing as needed > Only use your albuterol as a rescue medication  Try prilosec 20mg   Take 30-60 min before first meal of the day and Pepcid (famotidine) 20 mg one bedtime until cough is completely gone for at least a week without the need for cough suppression then stop it  GERD diet   schedule sinus ct> neg   12/10/2014 f/u ov/[name] re: severe cough / maint on gerdr rx and dulera 100 2 bid/  not sure how to use flutter Chief Complaint  Patient presents with  . Follow-up    Cough is much improved since her last visit.    Little hacking cough spring / fall all her life and feels back to baseline/ seems worse first thing in am rec Try to leave off the tramadol Continue dulera 100 Take 2 puffs first thing in am and then another 2 puffs about 12 hours later Continue  prilosec 20mg   Take 30-60 min before first meal of the day and Pepcid (famotidine) 20 mg one bedtime and chlortrimeton 4 mg 1-2 at bedtime to see what effect it has on your hacky am cough  For drainage take chlortrimeton (chlorpheniramine) 4 mg every 4 hours available over the counter (may cause drowsiness)    01/12/2015 f/u ov/[name] re: recurrent cough  Chief Complaint  Patient presents with  . Follow-up    Pt states her cough has been worse for the past 2 wks- relates to working outside in the heat. Cough is prod with tan sputum.    Was better, now worse with cough more day than noct, more outdoors than indoors / not really clear what / how she takes her meds  rec Dulera 100 Take 2 puffs first thing in am and then another 2 puffs about 12 hours later.  Try prilosec otc   Take 30-60 min before first meal of the day and Pepcid ac (famotidine) 20 mg one @  bedtime until cough is completely gone for at least a week without the need for cough suppression and your voice is completely normal  Only use your albuterol as a rescue medication For drainage take chlortrimeton (chlorpheniramine) 4 mg every 4 hours available over the counter (may cause drowsiness)  For cough mucinex dm up to 1200 mg every 12 hours as needed and use the flutter valve to prevent airway trauma    03/22/2015  f/u ov/[name] re: severe chronic cough/ resolved on rx  Chief Complaint  Patient presents with  . Follow-up    Pt states her cough has completely resolved. She is here today to discuss alternative for [facility]. She has not had to use albuterol  for the past month.      No obvious day to day or daytime variabilty or assoc sob or cp or chest tightness, subjective wheeze overt sinus or hb symptoms. No unusual exp hx or h/o childhood pna/ asthma or knowledge of premature birth.  Sleeping ok without nocturnal  or early am exacerbation  of respiratory  c/o's or need for noct saba. Also denies any obvious fluctuation of symptoms with weather or environmental changes or other aggravating or alleviating factors except as outlined above   Current Medications, Allergies, Complete Past Medical History, Past Surgical History, Family History, and Social History were reviewed in [employer] record.  ROS  The following are not active complaints unless bolded sore throat, dysphagia, dental problems, itching, sneezing,  nasal congestion or excess/ purulent secretions, ear ache,   fever, chills, sweats, unintended wt loss, pleuritic or exertional cp, hemoptysis,  orthopnea pnd or leg swelling, presyncope, palpitations, abdominal pain, anorexia, nausea, vomiting, diarrhea  or change in bowel or urinary habits, change in stools or urine, dysuria,hematuria,  rash, arthralgias, visual complaints, headache, numbness weakness or ataxia or problems with walking or coordination,  change in mood/affect or memory.            Objective:   Physical Exam  amb wf with nad     01/12/2015        184  > 03/22/2015   184   Wt Readings from Last 3 Encounters:  12/10/14 186 lb (84.369 kg)  11/25/14 185 lb (83.915 kg)    Vital signs reviewed      HEENT: nl dentition, turbinates, and orophanx. Nl external ear canals without cough reflex   NECK :  without JVD/Nodes/TM/ nl carotid upstrokes bilaterally   LUNGS: no acc muscle use, clear to A and P bilaterally without cough on insp or exp maneuvers   CV:  RRR  no s3 or murmur or increase in P2, no edema   ABD:  soft and nontender with nl excursion in the supine position. No bruits or  organomegaly, bowel sounds nl  MS:  warm without deformities, calf tenderness, cyanosis or clubbing  SKIN: warm and dry without lesions    NEURO:  alert, approp, no deficits     CXR PA and Lateral:   11/25/2014 :     I personally reviewed images and agree with radiology impression as follows:    Mild/mod kyphosis, no acute changes          Assessment & Plan:   Outpatient Encounter  Prescriptions as of 03/22/2015  Medication Sig  . albuterol (PROAIR HFA) 108 (90 BASE) MCG/ACT inhaler Inhale 2 puffs into the lungs every 6 (six) hours as needed for wheezing or shortness of breath.  Marland Kitchen b complex vitamins tablet Take 1 tablet by mouth daily.  . Calcium Carbonate (CALCIUM 600 PO) Take 2 tablets by mouth daily.  . Ergocalciferol (VITAMIN D2) 2000 UNITS TABS Take 1 tablet by mouth daily.  . hydroxychloroquine (PLAQUENIL) 200 MG tablet Take 200 mg by mouth 2 (two) times daily.  . Multiple Vitamin (MULTIVITAMIN) capsule Take 1 capsule by mouth daily.  Marland Kitchen Respiratory Therapy Supplies (FLUTTER) DEVI Use as directed  . traMADol (ULTRAM) 50 MG tablet 1-2 every 4 hours as needed for cough or pain  . valACYclovir (VALTREX) 1000 MG tablet Take 2 g by mouth as needed.  . [DISCONTINUED] mometasone-formoterol (DULERA) 100-5 MCG/ACT AERO Inhale 2 puffs into the lungs 2 (two) times daily.  . fluticasone-salmeterol (ADVAIR HFA) 115-21 MCG/ACT inhaler Inhale 2 puffs into the lungs 2 (two) times daily.  . [DISCONTINUED] mometasone-formoterol (DULERA) 100-5 MCG/ACT AERO Inhale 2 puffs into the lungs 2 (two) times daily.   No facility-administered encounter medications on file as of 03/22/2015.

## 2015-03-22 NOTE — Patient Instructions (Addendum)
Ok to change to advair hfa 115  Take 2 puffs first thing in am and then another 2 puffs about 12 hours later.   Only use your albuterol as a rescue medication to be used if you can't catch your breath by resting or doing a relaxed purse lip breathing pattern.  - The less you use it, the better it will work when you need it. - Ok to use up to 2 puffs  every 4 hours if you must but call for immediate appointment if use goes up over your usual need - Don't leave home without it !!  (think of it like the spare tire for your car)   Please schedule a follow up visit in 3 months but call sooner if needed

## 2015-03-23 DIAGNOSIS — E669 Obesity, unspecified: Secondary | ICD-10-CM | POA: Insufficient documentation

## 2015-03-23 NOTE — Assessment & Plan Note (Addendum)
-   11/25/2014 p extensive coaching HFA effectiveness =    75% > try dulera 100 and stop advair > resolved but insurance doesn't cover dulera > restarted advair in hfa form @ 115 2bid   I am delighted she's finally turned the corner on treatment directed at  asthma and notes she is no longer taking any form of reflux treatment at this point and is still under excellent control.   One issue is whether the Advair actually was contributing to the cough in the first place which I have seen before but typically this is the DPI not the HFA. Should be fine therefore to give the [facility] form and see if she exacerbates on this and if so we can do a prior approval for [facility] or try Symbicort 80 strength.  Formulary restrictions will be an ongoing challenge for the forseable future and I would be happy to pick an alternative if the pt will first  provide me a list of them but pt  will need to return here for training for any new device that is required eg dpi vs hfa vs respimat.    In meantime we can always provide samples so the patient never runs out of any needed respiratory medications.   I had an extended discussion with the patient reviewing all relevant studies completed to date and  lasting 15 to 20 minutes of a 25 minute visit    Each maintenance medication was reviewed in detail including most importantly the difference between maintenance and prns and under what circumstances the prns are to be triggered using an action plan format that is not reflected in the computer generated alphabetically organized AVS.    Please see instructions for details which were reviewed in writing and the patient given a copy highlighting the part that I personally wrote and discussed at today's ov.

## 2015-03-23 NOTE — Assessment & Plan Note (Addendum)
-   sinus ct 12/01/2014 > Mild mucosal edema in the maxillary sinus bilaterally. Remaining sinuses are clear. No air-fluid level - Allergy profile 01/12/15 > Eos 0.7/  IgE 27, min pos mold on RAST  She is no longer requiring any form of narcotic for cough suppression which is a very good sign.  We will leave off treatment for reflux for now but emphasized that if she does start coughing for any reason she should use a flutter valve to prevent  refractory cyclical coughing.

## 2015-03-23 NOTE — Assessment & Plan Note (Signed)
Body mass index is 32.92 kg/(m^2).  No results found for: TSH   Contributing to gerd tendency/ doe/reviewed the need and the process to achieve and maintain neg calorie balance > defer f/u primary care including intermittently monitoring thyroid status

## 2015-04-19 DIAGNOSIS — Z9109 Other allergy status, other than to drugs and biological substances: Secondary | ICD-10-CM

## 2015-04-19 HISTORY — DX: Other allergy status, other than to drugs and biological substances: Z91.09

## 2015-06-07 ENCOUNTER — Telehealth: Payer: Self-pay | Admitting: Internal Medicine

## 2015-06-07 MED ORDER — FLUTICASONE-SALMETEROL 115-21 MCG/ACT IN AERO: 2.0000 | INHALATION_SPRAY | Freq: Two times a day (BID) | RESPIRATORY_TRACT | Status: DC

## 2015-06-07 NOTE — Telephone Encounter (Signed)
RX has been sent in for advair. Nothing further needed

## 2015-06-19 DIAGNOSIS — G522 Disorders of vagus nerve: Secondary | ICD-10-CM

## 2015-06-19 HISTORY — DX: Disorders of vagus nerve: G52.2

## 2015-06-22 ENCOUNTER — Ambulatory Visit: Payer: BLUE CROSS/BLUE SHIELD | Admitting: Internal Medicine

## 2015-06-30 ENCOUNTER — Encounter: Payer: Self-pay | Admitting: Internal Medicine

## 2015-06-30 ENCOUNTER — Ambulatory Visit (INDEPENDENT_AMBULATORY_CARE_PROVIDER_SITE_OTHER): Payer: BLUE CROSS/BLUE SHIELD | Admitting: Internal Medicine

## 2015-06-30 VITALS — BP 132/92 | HR 75

## 2015-06-30 DIAGNOSIS — R058 Other specified cough: Secondary | ICD-10-CM

## 2015-06-30 DIAGNOSIS — R05 Cough: Secondary | ICD-10-CM | POA: Diagnosis not present

## 2015-06-30 DIAGNOSIS — I10 Essential (primary) hypertension: Secondary | ICD-10-CM | POA: Diagnosis not present

## 2015-06-30 DIAGNOSIS — J45991 Cough variant asthma: Secondary | ICD-10-CM | POA: Diagnosis not present

## 2015-06-30 MED ORDER — BUDESONIDE-FORMOTEROL FUMARATE 80-4.5 MCG/ACT IN AERO: INHALATION_SPRAY | RESPIRATORY_TRACT | Status: DC

## 2015-06-30 NOTE — Patient Instructions (Signed)
Change to symbicort 80 Take 2 puffs first thing in am and then another 2 puffs about 12 hours later.     Work on inhaler technique:  relax and gently blow all the way out then take a nice smooth deep breath back in, triggering the inhaler at same time you start breathing in.  Hold for up to 5 seconds if you can. Blow out thru nose. Rinse and gargle with water when done      If you are satisfied with your treatment plan,  let your doctor know and he/she can either refill your medications or you can return here when your prescription runs out.     If in any way you are not 100% satisfied,  please tell us.  If 100% better, tell your friends!  Pulmonary follow up is as needed

## 2015-06-30 NOTE — Progress Notes (Signed)
Subjective:    Patient ID: Cassidy Walton, female    DOB: June 12, 1961   MRN: [id number]    Brief patient profile:  [id number] [id number] never regular smoker with fall = spring allergies eval by Dr Nira RetortGene at this clinic mold rec shots declined and just used otc's then asthma problems mid 30's rx with prn saba rarely needed but seemed to trigger with sinus problems that recurred early May 2016 and using every 4 hours since despite rx with zpak/ pred so referred by [name] to pulmonary clinic p newly started on Advair 11/22/14 plus prednisone   History of Present Illness  11/25/2014 1st [facility] office visit/ Cassidy Walton   Chief Complaint  Patient presents with  . Pulmonary Consult    Referred by Dr. [name]. Pt states dxed with Asthma over 20 yrs ago.  She c/o cough for the past 3-4 wks. Cough was prod a few wks ago, but not for the past wk or so. Cough is much worse when she lies down- has to sleep sitting up.  She is using albuterol approx 6 x per day.   this am took advair and last dose of albterol 6 h prior to ov/ best she's felt was p Gates rx 6/8 with neb  All this started with sinus flare "like it always dose"  Cough >> sob and coughs so hard hurts over abd/ occ urinary incont  rec Stop advair and start new maintenance rx = dulera 100 Take 2 puffs first thing in am and then another 2 puffs about 12 hours later.  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ist, commercialperfecting  inhaler technique:   For cough as needed  mucinex dm 1200 mg every 12 hours and flutter valve  - if still still coughing then add tramadol 50 mg 1-2 hours every 12 hours For breathing as needed > Only use your albuterol as a rescue medication  Try prilosec 20mg   Take 30-60 min before first meal of the day and Pepcid (famotidine) 20 mg one bedtime until cough is completely gone for at least a week without the need for cough suppression then stop it  GERD diet   schedule sinus ct> neg   12/10/2014 f/u ov/Cassidy Walton re: severe cough / maint on gerdr rx and dulera 100 2 bid/  not sure how to use flutter Chief Complaint  Patient presents with  . Follow-up    Cough is much improved since her last visit.    Little hacking cough spring / fall all her life and feels back to baseline/ seems worse first thing in am rec Try to leave off the tramadol Continue dulera 100 Take 2 puffs first thing in am and then another 2 puffs about 12 hours later Continue  prilosec 20mg   Take 30-60 min before first meal of the day and Pepcid (famotidine) 20 mg one bedtime and chlortrimeton 4 mg 1-2 at bedtime to see what effect it has on your hacky am cough  For drainage take chlortrimeton (chlorpheniramine) 4 mg every 4 hours available over the counter (may cause drowsiness)    01/12/2015 f/u ov/Cassidy Walton re: recurrent cough  Chief Complaint  Patient presents with  . Follow-up    Pt states her cough has been worse for the past 2 wks- relates to working outside in the heat. Cough is prod with tan sputum.    Was better, now worse with cough more day than noct, more outdoors than indoors / not really clear what / how she takes her meds  rec Dulera 100 Take 2 puffs first thing in am and then another 2 puffs about 12 hours later.  Try prilosec otc 20mg   Take 30-60 min before first meal of the day and Pepcid ac (famotidine) 20 mg one @  bedtime until cough is completely gone for at least a week without the need for cough suppression and your voice is completely normal  Only use your albuterol as a rescue medication For drainage take chlortrimeton (chlorpheniramine) 4 mg every 4 hours available over the counter (may cause drowsiness)  For cough mucinex dm up to 1200 mg every 12 hours as needed and use the flutter valve to prevent airway trauma    03/22/2015  f/u ov/Cassidy Walton re: severe chronic cough/ resolved on rx with chlortrimeton/ dulera 100 changed to advair x one week prior to OV   Chief Complaint  Patient presents with  . Follow-up    Pt states her cough has completely resolved. She is here  today to discuss alternative for [facility]. She has not had to use albuterol for the past month.   rec Ok to change to advair hfa 115  Take 2 puffs first thing in am and then another 2 puffs about 12 hours later.  Only use your albuterol as a rescue medication     06/30/2015  f/u ov/Cassidy Walton re: cough variant asthma vs uacs  Chief Complaint  Patient presents with  . Follow-up    Pt states that her cough had been worse, but has now improved.   sleeping well at hs on h1  Not as happy with advair as dulera 100 >  feels a little chest tightness but no need for saba      No obvious day to day or daytime variabilty or assoc sob or cp or chest tightness, subjective wheeze overt sinus or hb symptoms. No unusual exp hx or h/o childhood pna/ asthma or knowledge of premature birth.  Sleeping ok without nocturnal  or early am exacerbation  of respiratory  c/o's or need for noct saba. Also denies any obvious fluctuation of symptoms with weather or environmental changes or other aggravating or alleviating factors except as outlined above   Current Medications, Allergies, Complete Past Medical History, Past Surgical History, Family History, and Social History were reviewed in [employer] record.  ROS  The following are not active complaints unless bolded sore throat, dysphagia, dental problems, itching, sneezing,  nasal congestion or excess/ purulent secretions, ear ache,   fever, chills, sweats, unintended wt loss, pleuritic or exertional cp, hemoptysis,  orthopnea pnd or leg swelling, presyncope, palpitations, abdominal pain, anorexia, nausea, vomiting, diarrhea  or change in bowel or urinary habits, change in stools or urine, dysuria,hematuria,  rash, arthralgias, visual complaints, headache, numbness weakness or ataxia or problems with walking or coordination,  change in mood/affect or memory.            Objective:   Physical Exam  amb wf with nad     01/12/2015        184  >  03/22/2015   184   Wt Readings from Last 3 Encounters:  12/10/14 186 lb (84.369 kg)  11/25/14 185 lb (83.915 kg)    Vital signs reviewed / bp noted      HEENT: nl dentition, turbinates, and orophanx. Nl external ear canals without cough reflex   NECK :  without JVD/Nodes/TM/ nl carotid upstrokes bilaterally   LUNGS: no acc muscle use, clear to A and P bilaterally without  cough on insp or exp maneuvers   CV:  RRR  no s3 or murmur or increase in P2, no edema   ABD:  soft and nontender with nl excursion in the supine position. No bruits or organomegaly, bowel sounds nl  MS:  warm without deformities, calf tenderness, cyanosis or clubbing  SKIN: warm and dry without lesions    NEURO:  alert, approp, no deficits     CXR PA and Lateral:   11/25/2014 :     I personally reviewed images and agree with radiology impression as follows:    Mild/mod kyphosis, no acute changes          Assessment & Plan:   Outpatient Encounter Prescriptions as of 06/30/2015  Medication Sig  . albuterol (PROAIR HFA) 108 (90 BASE) MCG/ACT inhaler Inhale 2 puffs into the lungs every 6 (six) hours as needed for wheezing or shortness of breath.  Marland Kitchen b complex vitamins tablet Take 1 tablet by mouth daily.  . Calcium Carbonate (CALCIUM 600 PO) Take 2 tablets by mouth daily.  . chlorpheniramine (CHLOR-TRIMETON) 4 MG tablet Take 4 mg by mouth at bedtime.  . Ergocalciferol (VITAMIN D2) 2000 UNITS TABS Take 1 tablet by mouth daily.  . hydroxychloroquine (PLAQUENIL) 200 MG tablet Take 200 mg by mouth 2 (two) times daily.  . Multiple Vitamin (MULTIVITAMIN) capsule Take 1 capsule by mouth daily.  Marland Kitchen Respiratory Therapy Supplies (FLUTTER) DEVI Use as directed  . traMADol (ULTRAM) 50 MG tablet 1-2 every 4 hours as needed for cough or pain  . valACYclovir (VALTREX) 1000 MG tablet Take 2 g by mouth as needed.  . [DISCONTINUED] fluticasone-salmeterol (ADVAIR HFA) 115-21 MCG/ACT inhaler Inhale 2 puffs into the lungs 2  (two) times daily.  . budesonide-formoterol (SYMBICORT) 80-4.5 MCG/ACT inhaler Take 2 puffs first thing in am and then another 2 puffs about 12 hours later.   No facility-administered encounter medications on file as of 06/30/2015.

## 2015-07-04 ENCOUNTER — Encounter: Payer: Self-pay | Admitting: Internal Medicine

## 2015-07-04 DIAGNOSIS — I10 Essential (primary) hypertension: Secondary | ICD-10-CM | POA: Insufficient documentation

## 2015-07-04 NOTE — Assessment & Plan Note (Signed)
-   11/25/2014 p extensive coaching HFA effectiveness =    75% > try dulera 100 and stop advair > resolved but insurance doesn't cover dulera > restarted advair in hfa form @ 115 2bid 03/22/2015 > reported pm chest tightness off dulera  - change back symbicort 80 2bid and given "Zero" coupon so she can afford it     I had an extended final summary discussion with the patient reviewing all relevant studies completed to date and  lasting 15 to 20 minutes of a 25 minute visit on the following issues:    - 06/30/2015  extensive coaching HFA effectiveness =    75%   Formulary restrictions will be an ongoing challenge for the forseable future and I would be happy to pick an alternative if the pt will first  provide me a list of them but pt  will need to return here for training for any new device that is required eg dpi vs hfa vs respimat.    In meantime we can always provide samples so the patient never runs out of any needed respiratory medications.   Try symbicort 80 2bid and zero coupon given so there should be no copay    Each maintenance medication was reviewed in detail including most importantly the difference between maintenance and as needed and under what circumstances the prns are to be used.  Please see instructions for details which were reviewed in writing and the patient given a copy.    Pulmonary f/u can be prn

## 2015-07-04 NOTE — Assessment & Plan Note (Signed)
Borderline, no need for rx now > Follow up per Primary Care planned

## 2015-07-04 NOTE — Assessment & Plan Note (Signed)
-   sinus ct 12/01/2014 > Mild mucosal edema in the maxillary sinus bilaterally. Remaining sinuses are clear. No air-fluid level - Allergy profile 01/12/15 > Eos 0.7/  IgE 27, min pos mold on RAST  Controlled on 1st gen h1 and prn flutter valve / reminded to start back on gerd rx for any flare of cough and use flutter to prevent recurrent cyclical coughing

## 2015-09-13 ENCOUNTER — Other Ambulatory Visit: Payer: Self-pay

## 2015-09-13 DIAGNOSIS — Z1231 Encounter for screening mammogram for malignant neoplasm of breast: Secondary | ICD-10-CM

## 2015-09-14 ENCOUNTER — Telehealth: Payer: Self-pay | Admitting: Internal Medicine

## 2015-09-14 NOTE — Telephone Encounter (Signed)
ATC pt x 3 - line busy.  WCB 

## 2015-09-15 NOTE — Telephone Encounter (Signed)
Spoke with pt. States that she wanted to make sure she is not supposed to go back on [place]. Her pharmacy had refilled this for her and wanted to make sure. Per OV instructions she was to change to Symbicort. Pt is aware that she needs to continue with Symbicort and not Dulera. Nothing further was needed.

## 2015-09-19 ENCOUNTER — Ambulatory Visit (INDEPENDENT_AMBULATORY_CARE_PROVIDER_SITE_OTHER): Payer: BLUE CROSS/BLUE SHIELD | Admitting: Nurse Practitioner

## 2015-09-19 ENCOUNTER — Encounter: Payer: Self-pay | Admitting: Nurse Practitioner

## 2015-09-19 VITALS — BP 140/80 | HR 84 | Resp 16 | Ht 62.0 in | Wt 182.4 lb

## 2015-09-19 DIAGNOSIS — Z1151 Encounter for screening for human papillomavirus (HPV): Secondary | ICD-10-CM | POA: Diagnosis not present

## 2015-09-19 DIAGNOSIS — Z0000 Encounter for general adult medical examination without abnormal findings: Secondary | ICD-10-CM | POA: Diagnosis not present

## 2015-09-19 DIAGNOSIS — Z01419 Encounter for gynecological examination (general) (routine) without abnormal findings: Secondary | ICD-10-CM

## 2015-09-19 DIAGNOSIS — I10 Essential (primary) hypertension: Secondary | ICD-10-CM | POA: Diagnosis not present

## 2015-09-19 DIAGNOSIS — N952 Postmenopausal atrophic vaginitis: Secondary | ICD-10-CM | POA: Diagnosis not present

## 2015-09-19 LAB — POCT URINALYSIS DIPSTICK
Bilirubin, UA: NEGATIVE
Glucose, UA: NEGATIVE
Ketones, UA: NEGATIVE
LEUKOCYTES UA: NEGATIVE
NITRITE UA: NEGATIVE
PH UA: 5
Protein, UA: NEGATIVE
RBC UA: NEGATIVE

## 2015-09-19 LAB — HEPATITIS C ANTIBODY: HCV AB: NEGATIVE

## 2015-09-19 LAB — HIV ANTIBODY (ROUTINE TESTING W REFLEX): HIV: NONREACTIVE

## 2015-09-19 NOTE — Progress Notes (Signed)
Patient ID: Cassidy Walton, female   DOB: 19-Jun-1960, 55 y.o.   MRN: [id number]  55 y.o. G0P0 Single  Caucasian Fe here for annual exam.  Same partner 28 yrs.  It has been several yrs since last here.  She has been off OCP used for help with migraine HA's since early 2014.  Now increased pain with SA.  She has tried OTC lubrication without much help she would like to try vaginal estrogen.  The polymyalgia is stable most of time.   Patient's last menstrual period was 03/24/2011.          Sexually active: Yes.    The current method of family planning is post menopausal status.    Exercising: No.   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 She has never used smokeless tobacco. She reports that she drinks alcohol. She reports that she does not use illicit drugs.  Past Medical History  Diagnosis Date  . Abnormal pap     CIN III  . History of polymyalgia rheumatica   . Spastic colon     hx of Spastic colon  . Heart murmur   . Environmental allergies 04/19/15    Past Surgical History  Procedure Laterality Date  . Cholecystectomy      Current Outpatient Prescriptions  Medication Sig Dispense Refill  . albuterol (PROAIR HFA) 108 (90 BASE) MCG/ACT inhaler Inhale 2 puffs into the lungs every 6 (six) hours as needed for wheezing or shortness of breath.    Marland Kitchen. b complex vitamins tablet Take 1 tablet by mouth daily.    . budesonide-formoterol (SYMBICORT) 80-4.5 MCG/ACT inhaler Take 2 puffs first thing in am and then another 2 puffs about 12 hours later. 1 Inhaler 12  . Calcium Carbonate (CALCIUM 600 PO) Take 2 tablets by mouth daily.    . chlorpheniramine (CHLOR-TRIMETON) 4 MG tablet Take 4 mg  by mouth at bedtime.    . Ergocalciferol (VITAMIN D2) 2000 UNITS TABS Take 1 tablet by mouth daily.    . famotidine (PEPCID) 10 MG tablet Take 10 mg by mouth at bedtime.    . hydroxychloroquine (PLAQUENIL) 200 MG tablet Take 200 mg by mouth 2 (two) times daily.    . Multiple Vitamin (MULTIVITAMIN) capsule Take 1 capsule by mouth daily.    Marland Kitchen. Respiratory Therapy Supplies (FLUTTER) DEVI Use as directed 1 each 0  . traMADol (ULTRAM) 50 MG tablet 1-2 every 4 hours as needed for cough or pain 40 tablet 0  . valACYclovir (VALTREX) 1000 MG tablet Take 2 g by mouth as needed.     No current facility-administered medications for this visit.    Family History  Problem Relation Age of Onset  . Stroke Maternal Grandmother   . Heart disease Maternal Grandmother   . Hypertension Maternal Grandmother   . Stroke Paternal Grandmother   . Heart disease Paternal Grandmother   . Allergies      "everyone"  . Heart failure Father   . Osteoarthritis Mother   . Hypertension Mother   . Obesity Sister   . Osteoarthritis Sister   . Osteoarthritis Brother  ROS:  Pertinent items are noted in HPI.  Otherwise, a comprehensive ROS was negative.  Exam:   BP 140/80 mmHg  Pulse 84  Resp 16  Ht  (1.575 m)  Wt 182 lb 6.4 oz (82.736 kg)  BMI 33.35 kg/m2  LMP 03/24/2011 Height:  (157.5 cm) Ht Readings from Last 3 Encounters:  09/19/15  (1.575 m)  03/22/15 5' 2.75" (1.594 m)  12/10/14 5' (1.524 m)    General appearance: alert, cooperative and appears stated age Head: Normocephalic, without obvious abnormality, atraumatic Neck: no adenopathy, supple, symmetrical, trachea midline and thyroid normal to inspection and palpation Lungs: clear to auscultation bilaterally Breasts: normal appearance, no masses or tenderness Heart: regular rate and rhythm Abdomen: soft, non-tender; no masses,  no organomegaly Extremities: extremities normal, atraumatic, no cyanosis or edema Skin: Skin color,  texture, turgor normal. No rashes or lesions Lymph nodes: Cervical, supraclavicular, and axillary nodes normal. No abnormal inguinal nodes palpated Neurologic: Grossly normal   Pelvic: External genitalia:  no lesions              Urethra:  normal appearing urethra with no masses, tenderness or lesions              Bartholin's and Skene's: normal                 Vagina: atrophic appearing vagina with normal color and discharge, no lesions              Cervix: anteverted              Pap taken: Yes.   Bimanual Exam:  Uterus:  normal size, contour, position, consistency, mobility, non-tender              Adnexa: no mass, fullness, tenderness               Rectovaginal: Confirms               Anus:  normal sphincter tone, no lesions  Chaperone present: yes  A:  Well Woman with normal exam  Postmenopausal no HRT  History of Polymyalgia Rheumatica  Remote history of CIN III 12/1989 - normal since  History of hypercholesterolemia  HTN  Dyspareunia with vaginal atrophy     P:   Reviewed health and wellness pertinent to exam  Pap smear as above  Mammogram is due and is scheduled for 10/04/15  If Mammo is normal then she would like to try vaginal estrogen = she will check on insurance coverage for Estrace vs. Vagifem generic.  She is given potential risk including DVT, CVA, cancer, etc.  Will follow with labs  She will check to see if insurance will let her see Dr. Loreta Ave or get colonoscopy done at another facility. - may need help with a referral - she is to let us know.  Counseled on breast self exam, mammography screening, adequate intake of calcium and vitamin D, diet and exercise, Kegel's exercises return annually or prn  An After Visit Summary was printed and given to the patient.

## 2015-09-19 NOTE — Patient Instructions (Signed)

## 2015-09-20 NOTE — Progress Notes (Signed)
Encounter reviewed Gurman Ashland, MD   

## 2015-09-21 LAB — IPS PAP TEST WITH HPV

## 2015-10-04 ENCOUNTER — Ambulatory Visit
Admission: RE | Admit: 2015-10-04 | Discharge: 2015-10-04 | Disposition: A | Discharge status: Home / Self Care | Payer: BLUE CROSS/BLUE SHIELD | Source: Ambulatory Visit

## 2015-10-04 DIAGNOSIS — Z1231 Encounter for screening mammogram for malignant neoplasm of breast: Secondary | ICD-10-CM

## 2015-10-18 ENCOUNTER — Telehealth: Payer: Self-pay | Admitting: Certified Nurse Midwife

## 2015-10-18 NOTE — Telephone Encounter (Signed)
Patient is calling to give information to Verner Choleborah Leonard's nurse after talking with her insurance company.  Patient said this is regarding a prescription.

## 2015-10-18 NOTE — Telephone Encounter (Signed)
Spoke with patient. Patient states that she spoke with her insurance company and was advised that the generic Estrace vaginal tablets and generic Vagifem will be covered. States that brand name will be $80 for each and she can not afford that per month. Patient states that she would like to start on whichever mediation Ria CommentPatricia Grubb, FNP feels will be best. She is using the [facility] off [address]. Advised I will speak with Ria CommentPatricia Grubb, FNP regarding her recommendations and return call. She is agreeable.

## 2015-10-24 ENCOUNTER — Other Ambulatory Visit: Payer: Self-pay | Admitting: Nurse Practitioner

## 2015-10-24 MED ORDER — ESTRADIOL 10 MCG VA TABS: ORAL_TABLET | VAGINAL | Status: DC

## 2015-10-24 NOTE — Telephone Encounter (Signed)
I will send in order for generic Vagifem to her pharmacy.

## 2015-10-25 NOTE — Telephone Encounter (Signed)
Spoke with patient. Advised of message as seen below from Patricia Grubb, FNP. She is agreeable and verbalizes understanding.  Routing to provider for final review. Patient agreeable to disposition. Will close encounter.  

## 2015-12-08 DIAGNOSIS — M1990 Unspecified osteoarthritis, unspecified site: Secondary | ICD-10-CM | POA: Diagnosis not present

## 2015-12-08 DIAGNOSIS — M542 Cervicalgia: Secondary | ICD-10-CM | POA: Diagnosis not present

## 2015-12-08 DIAGNOSIS — E559 Vitamin D deficiency, unspecified: Secondary | ICD-10-CM | POA: Diagnosis not present

## 2016-03-29 ENCOUNTER — Ambulatory Visit
Admission: RE | Admit: 2016-03-29 | Discharge: 2016-03-29 | Disposition: A | Discharge status: Home / Self Care | Payer: BLUE CROSS/BLUE SHIELD | Source: Ambulatory Visit | Attending: Family Medicine | Admitting: Family Medicine

## 2016-03-29 ENCOUNTER — Other Ambulatory Visit: Payer: Self-pay | Admitting: Family Medicine

## 2016-03-29 DIAGNOSIS — M79672 Pain in left foot: Secondary | ICD-10-CM | POA: Diagnosis not present

## 2016-03-29 DIAGNOSIS — W19XXXA Unspecified fall, initial encounter: Secondary | ICD-10-CM

## 2016-03-29 DIAGNOSIS — J4520 Mild intermittent asthma, uncomplicated: Secondary | ICD-10-CM | POA: Diagnosis not present

## 2016-05-30 DIAGNOSIS — M542 Cervicalgia: Secondary | ICD-10-CM | POA: Diagnosis not present

## 2016-05-30 DIAGNOSIS — M1990 Unspecified osteoarthritis, unspecified site: Secondary | ICD-10-CM | POA: Diagnosis not present

## 2016-05-30 DIAGNOSIS — E559 Vitamin D deficiency, unspecified: Secondary | ICD-10-CM | POA: Diagnosis not present

## 2016-05-30 DIAGNOSIS — R21 Rash and other nonspecific skin eruption: Secondary | ICD-10-CM | POA: Diagnosis not present

## 2016-06-01 DIAGNOSIS — J310 Chronic rhinitis: Secondary | ICD-10-CM | POA: Diagnosis not present

## 2016-06-01 DIAGNOSIS — J45901 Unspecified asthma with (acute) exacerbation: Secondary | ICD-10-CM | POA: Diagnosis not present

## 2016-06-01 DIAGNOSIS — J069 Acute upper respiratory infection, unspecified: Secondary | ICD-10-CM | POA: Diagnosis not present

## 2016-06-27 ENCOUNTER — Ambulatory Visit (INDEPENDENT_AMBULATORY_CARE_PROVIDER_SITE_OTHER)
Admission: RE | Admit: 2016-06-27 | Discharge: 2016-06-27 | Disposition: A | Discharge status: Home / Self Care | Payer: BLUE CROSS/BLUE SHIELD | Source: Ambulatory Visit | Attending: Acute Care | Admitting: Acute Care

## 2016-06-27 ENCOUNTER — Encounter: Payer: Self-pay | Admitting: Acute Care

## 2016-06-27 ENCOUNTER — Ambulatory Visit (INDEPENDENT_AMBULATORY_CARE_PROVIDER_SITE_OTHER): Payer: BLUE CROSS/BLUE SHIELD | Admitting: Acute Care

## 2016-06-27 ENCOUNTER — Telehealth: Payer: Self-pay | Admitting: Internal Medicine

## 2016-06-27 DIAGNOSIS — R059 Cough, unspecified: Secondary | ICD-10-CM

## 2016-06-27 DIAGNOSIS — R05 Cough: Secondary | ICD-10-CM | POA: Diagnosis not present

## 2016-06-27 DIAGNOSIS — J45991 Cough variant asthma: Secondary | ICD-10-CM

## 2016-06-27 MED ORDER — PREDNISONE 10 MG PO TABS: ORAL_TABLET | ORAL | 0 refills | Status: DC

## 2016-06-27 MED ORDER — AZITHROMYCIN 250 MG PO TABS: ORAL_TABLET | ORAL | 0 refills | Status: DC

## 2016-06-27 NOTE — Telephone Encounter (Signed)
Pt aware that the switch was okayed by both MDs.  Pt scheduled for Consult with Dr Marchelle Gearingamaswamy on 07/27/16 at 2:15  Since patient is having a worsening cough with congestion and mucus production she was scheduled to see Cassidy RobinsonsSarah Groce, NP today . Pt is going out of town for about 6 weeks and I urged her to be seen by one of our NPs prior to leaving. Pt states that she has had the cough for about 1 month. Pt took Prednisone (5 day taper) and states that she improved a lot but the further she gets away from the Prednisone the worse her cough is getting. Pt reports waking up at night several times d/t the cough.   Pt scheduled with SG today at 430. Per SG get a CXR prior the visit. CXR ordered and patient aware to arrive between 4pm-415pm today to have this done.   Nothing further needed.

## 2016-06-27 NOTE — Progress Notes (Signed)
History of Present Illness Cassidy Walton is a 56 y.o. female with cough variant asthma and fall/  spring allergies followed initially by Dr. Sherene SiresWert, but will switch to Dr. Marchelle Gearingamaswamy 07/27/2017.    06/27/2016 Acute OV  06/01/2016 Saw PCP for cough and chest heaviness and congestion.She and her husband both had a upper respiratory infection.She was treated at that time with pred taper and Flonase and Afrin x 3 days.She did get better on the prednisone.She presents today with continued  cough with deep breath. It wakes her at night. She states she is wheezing.She coughs up secretions that range from tan to a light beige. She is compliant with her  flonase, Mucinex, Pepcid, Symbiort and Chlortrimaton. She had stopped taking her PPI. She states cough is worse at night and in the morning. She is going out of town for 3 weeks and wanted to be seen prior to leaving. 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 She denied fever, chest pain, orthopnea, no hemoptysis.  Tests CXR 06/27/2016 IMPRESSION: No active cardiopulmonary disease.  Past medical hx Past Medical History:  Diagnosis Date  . Abnormal pap    CIN III  . Environmental allergies 04/19/15  . Heart murmur   . History of polymyalgia rheumatica   . Spastic colon    hx of Spastic colon     Past surgical hx, Family hx, Social hx all reviewed.  Current Outpatient Prescriptions on File Prior to Visit  Medication Sig  . albuterol (PROAIR HFA) 108 (90 BASE) MCG/ACT inhaler Inhale 2 puffs into the lungs every 6 (six) hours as needed for wheezing or shortness of breath.  Marland Kitchen. b complex vitamins tablet Take 1 tablet by mouth daily.  . budesonide-formoterol (SYMBICORT) 80-4.5 MCG/ACT inhaler Take 2 puffs first thing in am and then another 2 puffs about 12 hours later.  . Calcium Carbonate (CALCIUM 600 PO) Take 2 tablets by mouth daily.  . chlorpheniramine (CHLOR-TRIMETON) 4 MG tablet Take 4 mg by  mouth at bedtime.  . Ergocalciferol (VITAMIN D2) 2000 UNITS TABS Take 1 tablet by mouth daily.  . Estradiol (VAGIFEM) 10 MCG TABS vaginal tablet Use every night before bed for 2 wks then twice a week  . famotidine (PEPCID) 10 MG tablet Take 10 mg by mouth at bedtime.  . hydroxychloroquine (PLAQUENIL) 200 MG tablet Take 200 mg by mouth 2 (two) times daily.  . Multiple Vitamin (MULTIVITAMIN) capsule Take 1 capsule by mouth daily.  Marland Kitchen. Respiratory Therapy Supplies (FLUTTER) DEVI Use as directed  . traMADol (ULTRAM) 50 MG tablet 1-2 every 4 hours as needed for cough or pain  . valACYclovir (VALTREX) 1000 MG tablet Take 2 g by mouth as needed.   No current facility-administered medications on file prior to visit.      Allergies  Allergen Reactions  . Bee Venom   . Codeine Sulfate     "makes me cry"  . Penicillins Hives    Fever, hallucation per pt    Review Of Systems:  Constitutional:   No  weight loss, night sweats,  Fevers, chills, fatigue, or  lassitude.  HEENT:   No headaches,  Difficulty swallowing,  Tooth/dental problems, or  Sore throat,                No sneezing, itching, ear ache, nasal congestion, +post nasal drip,   CV:  No chest pain,  Orthopnea, PND, swelling in lower extremities,  anasarca, dizziness, palpitations, syncope.   GI  No heartburn, indigestion, abdominal pain, nausea, vomiting, diarrhea, change in bowel habits, loss of appetite, bloody stools.   Resp: No shortness of breath with exertion or at rest.  + excess mucus, + productive cough,  No non-productive cough,  No coughing up of blood.  + change in color of mucus.  + wheezing.  No chest wall deformity  Skin: no rash or lesions.  GU: no dysuria, change in color of urine, no urgency or frequency.  No flank pain, no hematuria   MS:  No joint pain or swelling.  No decreased range of motion.  No back pain.  Psych:  No change in mood or affect. No depression or anxiety.  No memory loss.   Vital Signs BP  120/72 (BP Location: Right Arm, Patient Position: Sitting, Cuff Size: Normal)   Pulse 66   Ht 5\' 2"  (1.575 m)   Wt 182 lb 6.4 oz (82.7 kg)   LMP 03/24/2011   SpO2 99%   BMI 33.36 kg/m    Physical Exam:  General- No distress,  A&Ox3, pleasant female ENT: No sinus tenderness, TM clear, pale nasal mucosa, no oral exudate,+ post nasal drip, no LAN Cardiac: S1, S2, regular rate and rhythm, no murmur Chest: Faint exp wheeze right side/ no  rales/ dullness; no accessory muscle use, no nasal flaring, no sternal retractions Abd.: Soft Non-tender Ext: No clubbing cyanosis, edema Neuro:  normal strength Skin: No rashes, warm and dry Psych: normal mood and behavior   Assessment/Plan  Cough variant asthma Flare after upper airway virus Plan: We will prescribe Prednisone taper; 10 mg tablets: 4 tabs x 2 days, 3 tabs x 2 days, 2 tabs x 2 days 1 tab x 2 days then stop. Z Pack Continue taking your flonase, Mucinex, Pepcid, Prilosec ,and Chlortrimeton and flutter valve. Continue using your rescue inhaler as needed for shortness of breath or wheezing. Add Delsym at bedtime for cough Sips of water instead of throat clearing Voice rest as able. Sugar free jolly ranchers for throat sooting Follow up appointment with [name] 07/27/2016 as is scheduled. Please contact office for sooner follow up if symptoms do not improve or worsen or seek emergency care       Bevelyn Ngo, NP 06/27/2016  9:14 PM

## 2016-06-27 NOTE — Telephone Encounter (Signed)
That is fine to switch. First avail 30 min please  Thanks  Dr. Kalman ShanMurali Darleny Sem, M.D., [facility] Pulmonary and Critical Care Medicine Staff Physician [facility] Pager: [phone], If no answer or between  15:00h - 7:00h: call [phone]  06/27/2016 11:45 AM

## 2016-06-27 NOTE — Assessment & Plan Note (Addendum)
Flare after upper airway virus Plan: We will prescribe Prednisone taper; 10 mg tablets: 4 tabs x 2 days, 3 tabs x 2 days, 2 tabs x 2 days 1 tab x 2 days then stop. Z Pack Continue taking your flonase, Mucinex, Pepcid, Prilosec ,and Chlortrimeton and flutter valve. Continue using your rescue inhaler as needed for shortness of breath or wheezing. Add Delsym at bedtime for cough Sips of water instead of throat clearing Voice rest as able. Sugar free jolly ranchers for throat sooting Follow up appointment with Dr. Marchelle Gearingamaswamy 07/27/2016 as is scheduled. Please contact office for sooner follow up if symptoms do not improve or worsen or seek emergency care

## 2016-06-27 NOTE — Patient Instructions (Addendum)
It is nice to meet you today. We will prescribe Prednisone taper; 10 mg tablets: 4 tabs x 2 days, 3 tabs x 2 days, 2 tabs x 2 days 1 tab x 2 days then stop. Z Pack Continue taking your flonase, Mucinex, Pepcid, Prilosec ,and Chlortrimeton and flutter valve. Continue using your rescue inhaler as needed for shortness of breath or wheezing. Add Delsym at bedtime for cough Sips of water instead of throat clearing Voice rest as able. Sugar free jolly ranchers for throat sooting Follow up appointment with Dr. Marchelle Gearingamaswamy 07/27/2016 as is scheduled. Please contact office for sooner follow up if symptoms do not improve or worsen or seek emergency care

## 2016-06-27 NOTE — Telephone Encounter (Signed)
Spoke with pt, requesting to switch providers from MW to MR.    MW/MR are you ok with this switch?  Thanks.

## 2016-06-27 NOTE — Telephone Encounter (Signed)
Fine with me

## 2016-06-27 NOTE — Telephone Encounter (Signed)
MR please advise thanks  

## 2016-07-27 ENCOUNTER — Encounter (INDEPENDENT_AMBULATORY_CARE_PROVIDER_SITE_OTHER): Payer: Self-pay

## 2016-07-27 ENCOUNTER — Ambulatory Visit (INDEPENDENT_AMBULATORY_CARE_PROVIDER_SITE_OTHER): Payer: BLUE CROSS/BLUE SHIELD | Admitting: Internal Medicine

## 2016-07-27 ENCOUNTER — Encounter: Payer: Self-pay | Admitting: Internal Medicine

## 2016-07-27 VITALS — BP 112/84 | HR 81 | Ht 62.0 in | Wt 183.0 lb

## 2016-07-27 DIAGNOSIS — J387 Other diseases of larynx: Secondary | ICD-10-CM | POA: Diagnosis not present

## 2016-07-27 DIAGNOSIS — R05 Cough: Secondary | ICD-10-CM

## 2016-07-27 DIAGNOSIS — R053 Chronic cough: Secondary | ICD-10-CM

## 2016-07-27 DIAGNOSIS — J45991 Cough variant asthma: Secondary | ICD-10-CM

## 2016-07-27 LAB — NITRIC OXIDE: NITRIC OXIDE: 27

## 2016-07-27 MED ORDER — GABAPENTIN 300 MG PO CAPS: ORAL_CAPSULE | ORAL | 5 refills | Status: DC

## 2016-07-27 NOTE — Progress Notes (Signed)
   Subjective:    Patient ID: Stevphen Rochesterarol J Victorian, female    DOB: 1961-04-04, 56 y.o.   MRN: [id number]  HPI    Review of Systems  Constitutional: Negative for fever and unexpected weight change.  HENT: Negative for congestion, dental problem, ear pain, nosebleeds, postnasal drip, rhinorrhea, sinus pressure, sneezing, sore throat and trouble swallowing.   Eyes: Negative for redness and itching.  Respiratory: Positive for cough and shortness of breath. Negative for chest tightness and wheezing.   Cardiovascular: Negative for palpitations and leg swelling.  Gastrointestinal: Negative for nausea and vomiting.  Genitourinary: Negative for dysuria.  Musculoskeletal: Negative for joint swelling.  Skin: Negative for rash.  Neurological: Negative for headaches.  Hematological: Does not bruise/bleed easily.  Psychiatric/Behavioral: Negative for dysphoric mood. The patient is not nervous/anxious.        Objective:   Physical Exam        Assessment & Plan:

## 2016-07-27 NOTE — Addendum Note (Signed)
Addended by: Sheran LuzEAST, Refujio Haymer K on: 07/27/2016 05:23 PM   Modules accepted: Orders

## 2016-07-27 NOTE — Patient Instructions (Addendum)
ICD-9-CM ICD-10-CM   1. Chronic cough 786.2 R05   2. 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 Cough variant asthma 493.82 J45.991    Cough is from possible sinus drainage, possib;le acid reflux, and defnite asthma,  All of this is working together to cause cyclical cough/LPR cough or irritable larynx  #Sinus drainage  - continue chlortrimeton  #Possible Acid Reflux  - cotnine prilosec  - At all times avoid colas, spices, cheeses, spirits, red meats, beer, chocolates, fried foods etc.,   - sleep with head end of bed elevated  - eat small frequent meals  - do not go to bed for 3 hours after last meal  #ASthma - I think you definitely have this at baseline - currently well controlled - continue symbicort as before   #Cyclical cough/Irritable Larynx  - please choose 2-3 days and observe complete voice rest - no talking or whispering  - at all times there  there is urge to cough, drink water or swallow or sip on throat lozenge - see Mr Verdie MosherCarl Schinke speech therapist - Take gabapentin 300mg  once daily x 5 days, then 300mg  twice daily x 5 days, then 300mg  three times daily to continue. If this makes you too sleepy or drowsy call us and we will cut your medication dosing down  #rule out ILD   - get HRCT anytime next few weeks  #Followup - I will see you in 4-6 weeks.  - any problems call or come sooner - cough score at followu
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
## 2016-07-31 ENCOUNTER — Telehealth: Payer: Self-pay | Admitting: Internal Medicine

## 2016-07-31 NOTE — Telephone Encounter (Signed)
Spoke with pt. She is aware of MR's response. Nothing further was needed. 

## 2016-07-31 NOTE — Telephone Encounter (Signed)
Ok I understaand respect her concerns about gabapentin. Typically what  I see is maybe around 20% of patients have fogginess, drowsiness, and that is why we go very slow on the titration so they can adapt.   I have not personally seen sugar drop but does not mean it cannot happen  Having said that if she does not want to do gabapentin that is fine. I will still support her care plan  I would definitely recommend voice rehab and also doing the voice rest for 2 days again in a few weeks   Dr. Kalman ShanMurali Skylan Lara, M.D., [facility]P Pulmonary and Critical Care Medicine Staff Physician [facility] Pager: [phone], If no answer or between  15:00h - 7:00h: call [phone]  07/31/2016 3:38 PM

## 2016-07-31 NOTE — Telephone Encounter (Signed)
Spoke with pt. She is concerned about taking Gabapentin. States that she read the inserts that came with this prescription. Read that gabapentin can cause your blood sugar to drop and she already has a problem with this. Pt is very apprehensive because she also read that you should not drive while on this medication and it could cause depression. [place] States that she doesn't want to start Gabapentin. Reports that she did complete voice rest for 3 straight days and her cough is markedly improved.  MR - please advise on Gabapentin use. Thanks.

## 2016-08-01 ENCOUNTER — Ambulatory Visit (INDEPENDENT_AMBULATORY_CARE_PROVIDER_SITE_OTHER)
Admission: RE | Admit: 2016-08-01 | Discharge: 2016-08-01 | Disposition: A | Discharge status: Home / Self Care | Payer: BLUE CROSS/BLUE SHIELD | Source: Ambulatory Visit | Attending: Internal Medicine | Admitting: Internal Medicine

## 2016-08-01 DIAGNOSIS — R053 Chronic cough: Secondary | ICD-10-CM

## 2016-08-01 DIAGNOSIS — R05 Cough: Secondary | ICD-10-CM | POA: Diagnosis not present

## 2016-08-01 DIAGNOSIS — R918 Other nonspecific abnormal finding of lung field: Secondary | ICD-10-CM | POA: Diagnosis not present

## 2016-08-01 DIAGNOSIS — J387 Other diseases of larynx: Secondary | ICD-10-CM

## 2016-08-01 DIAGNOSIS — J45991 Cough variant asthma: Secondary | ICD-10-CM

## 2016-08-03 ENCOUNTER — Telehealth: Payer: Self-pay | Admitting: Internal Medicine

## 2016-08-03 DIAGNOSIS — I2584 Coronary atherosclerosis due to calcified coronary lesion: Principal | ICD-10-CM

## 2016-08-03 DIAGNOSIS — I2510 Atherosclerotic heart disease of native coronary artery without angina pectoris: Secondary | ICD-10-CM

## 2016-08-03 NOTE — Telephone Encounter (Signed)
  1/ CT without mass or cancer or ILD- more added proof cough is neurogenic  2. Small lung nodule 5mm - very low risk for cancer -w ill do repeat ct chest in 1 y ear   3.  does have coronary artery calcification and if no normal cardiac stress test past few years; please refer to cardiologist - [facility] or Dr Jacinto HalimGanji, first available     Ct Chest High Resolution  Result Date: 08/01/2016 CLINICAL DATA:  Chronic cough. EXAM: CT CHEST WITHOUT CONTRAST TECHNIQUE: Multidetector CT imaging of the chest was performed following the standard protocol without intravenous contrast. High resolution imaging of the lungs, as well as inspiratory and expiratory imaging, was performed. COMPARISON:  06/27/2016 chest radiograph. FINDINGS: Cardiovascular: Normal heart size. No significant pericardial fluid/thickening. Left anterior descending and right coronary atherosclerosis. Mildly atherosclerotic and nonaneurysmal thoracic aorta. Normal caliber pulmonary arteries. Mediastinum/Nodes: No discrete thyroid nodules. Unremarkable esophagus. No pathologically enlarged axillary, mediastinal or gross hilar lymph nodes, noting limited sensitivity for the detection of hilar adenopathy on this noncontrast study. Lungs/Pleura: No pneumothorax. No pleural effusion. There a few scattered solid pulmonary nodules, largest 5 mm in the perifissural left lower lobe (series 3/ image 77). No acute consolidative airspace disease or lung masses. No significant air trapping on the expiration sequence. No significant regions of subpleural reticulation, ground-glass attenuation, traction bronchiectasis, parenchymal banding, architectural distortion or frank honeycombing. Upper abdomen: Cholecystectomy. Musculoskeletal: No aggressive appearing focal osseous lesions. Moderate thoracic spondylosis. IMPRESSION: 1. No evidence of interstitial lung disease. 2. A few scattered solid pulmonary nodules, largest 5 mm. No follow-up needed if patient is low-risk  (and has no known or suspected primary neoplasm). Non-contrast chest CT can be considered in 12 months if patient is high-risk. This recommendation follows the consensus statement: Guidelines for Management of Incidental Pulmonary Nodules Detected on CT Images: From the Fleischner Society 2017; Radiology 2017; 284:228-243. 3. Aortic atherosclerosis.  Two-vessel coronary atherosclerosis. Electronically Signed   By: Delbert PhenixJason A Poff M.D.   On: 08/01/2016 15:07

## 2016-08-06 NOTE — Telephone Encounter (Signed)
Left message for patient to contact office for medical results.

## 2016-08-10 NOTE — Telephone Encounter (Signed)
Patient returned call, CB is [phone].  States has been out of town but is back now.

## 2016-08-10 NOTE — Telephone Encounter (Signed)
Co artery calcification - cardioloigist will explain fully but sometimes (not always) is a marker that there might be a vessel blockage in heart. Cardiologiust might recommend stress test  Dr. Kalman ShanMurali Aniya Jolicoeur, M.D., [facility] Pulmonary and Critical Care Medicine Staff Physician [facility] Pager: [phone], If no answer or between  15:00h - 7:00h: call [phone]  08/10/2016 5:56 PM

## 2016-08-10 NOTE — Telephone Encounter (Signed)
Pt aware pf results and voiced her understanding. Pt would like to discuss the coronary artery calcification further. She also wanted to let MR know that she went three days without talking, and her coughing improved greatly. Cardiologist referral has been made.  MR please advise on the coronary artery calcification. Thanks.

## 2016-08-11 ENCOUNTER — Other Ambulatory Visit: Payer: Self-pay | Admitting: Internal Medicine

## 2016-08-13 NOTE — Telephone Encounter (Signed)
Spoke with pt. She is aware of MR's response. Nothing further was needed. 

## 2016-08-15 ENCOUNTER — Ambulatory Visit: Payer: BLUE CROSS/BLUE SHIELD | Attending: Internal Medicine

## 2016-08-15 DIAGNOSIS — R498 Other voice and resonance disorders: Secondary | ICD-10-CM | POA: Diagnosis not present

## 2016-08-16 NOTE — Therapy (Signed)
[facility] [address] [place], [place], [address] Phone: [phone]   Fax:  [phone]  Speech Language Pathology Evaluation  Patient Details  Name: Cassidy Walton MRN: [id number] Date of Birth: 21-Aug-1960 Referring Provider: Kalman Shanamaswamy, Murali, MD  Encounter Date: 08/15/2016      End of Session - 08/16/16 1205    Visit Number 1   Number of Visits 17   Date for SLP Re-Evaluation 10/19/16   SLP Start Time 0850   SLP Stop Time  0930   SLP Time Calculation (min) 40 min   Activity Tolerance Patient tolerated treatment well      Past Medical History:  Diagnosis Date  . Abnormal pap    CIN III  . Environmental allergies 04/19/15  . Heart murmur   . History of polymyalgia rheumatica   . Spastic colon    hx of Spastic colon    Past Surgical History:  Procedure Laterality Date  . CHOLECYSTECTOMY      There were no vitals filed for this visit.      Subjective Assessment - 08/15/16 0903    Subjective Pt had vocal reboot Feb 3-5th, with 80% decr in coughing/throat clearing since then.   Currently in Pain? No/denies            SLP Evaluation [facility] - 08/16/16 0001      SLP Visit Information   SLP Received On 08/15/16   Referring Provider Kalman Shanamaswamy, Murali, MD   Onset Date approx 2 years agao   Medical Diagnosis Chronic cough, Irritable larynx     General Information   HPI Pt with bronchitis (severe) approx 2 years ago with strong long-term coughing and afterwards pt with coughing spells and throat clearing - unproductive, worse wih strong odors and exercise primarily. Dr. Marchelle Gearingamaswamy suspects vocal cord dysfunction (VCD).     Prior Functional Status   Cognitive/Linguistic Baseline Within functional limits     Cognition   Overall Cognitive Status Within Functional Limits for tasks assessed     Auditory Comprehension   Overall Auditory Comprehension Appears within functional limits for tasks assessed      Verbal Expression   Overall Verbal Expression Appears within functional limits for tasks assessed     Oral Motor/Sensory Function   Overall Oral Motor/Sensory Function Appears within functional limits for tasks assessed     Motor Speech   Overall Motor Speech Appears within functional limits for tasks assessed      Pt and SLP discussed handout of vocal cord dysfunction (VCD) in order to educate pt about VCD and for SLP to assess triggers/causes/signs and symptoms of VCD. Pt presents with min hoarse/WNL voice. Pt's triggers of VCD include acid reflux, post-nasal drip, exercise, strong odors, and tobacco smoke. Symptoms were ID'd as dyspnea, tightness in throat/chest, frequent cough or throat clear, choking or suffocating feeling, noisy breathing, and hoarseness.   SLP and pt discussed relaxation techniques for pt to think about when VCD episodes occur including mental image of relaxation (pt is on the beach looking out over the ocean), feeling tension/tightness in throat melt away or evaporate, and imagining the throat expanding. SLP educated pt about and taught a sniff-pursed lip blow for pt to try when in a VCD episode.   Pt was educated about abdominal breathing, how it is essential in inhibiting/eliminating symptoms of VCD. SLP assessed pt to be a chest breather in conversation and largely breathing with her abdomen while at rest. The pt will benefit from  skilled ST addressing abdominal breathing (AB), and relaxation strategies, all to eliminate/reduce VCD and thus increase pt's QOL.                     SLP Education - 08/16/16 1204    Education provided Yes   Education Details vocal cord dysfunction s/s, triggers, reason for ST, sniff-blow technique, need for relaxation imagery   Person(s) Educated Patient   Methods Explanation;Demonstration;Verbal cues;Handout   Comprehension Verbalized understanding;Returned demonstration;Verbal cues required;Need further  instruction          SLP Short Term Goals - 08/16/16 1210      SLP SHORT TERM GOAL #1   Title pt will demo abdominal breathing (AB) in sentence response tasks 19/20 over two sessions   Time 4   Period Weeks   Status New     SLP SHORT TERM GOAL #2   Title pt will report decr of at least 85% (total) of overt s/s VCD   Time 4   Period Weeks   Status New     SLP SHORT TERM GOAL #3   Title pt will report using relaxation imagery for purpose of combat VCD symptoms, between 3 therapy sessions   Time 4   Period Weeks   Status New          SLP Long Term Goals - 08/16/16 1211      SLP LONG TERM GOAL #1   Title pt will reprot total decrease of overt s/s VCD of at least 90%   Time 8   Period Weeks   Status New     SLP LONG TERM GOAL #2   Title pt will use abdominal breathing 80% of the time in 10 mintues of mod complex conversation   Time 8   Period Weeks   Status New          Plan - 08/16/16 1206    Clinical Impression Statement Pt presents with reported s/s vocal cord dysfunction (VCD), and cleared throat 4x, coughed twice during the 40-minute evaluation. She reports decr'd QOL from coughing spells. She went on 3-day voice rest prior to ST evaluation, which has reduced overall coughing/throat clearing 80%. She also began to implement visualization and relaxation imagery but has approx 20% overt s/s VCD persisting. She is a chest-breather during conversation. Skilled ST necessary to teach pt abdominal breathing for use in conversation, adn transfer to various pertinent speaking situations.    Speech Therapy Frequency 2x / week   Duration --  8 weeks   Treatment/Interventions Compensatory strategies;Patient/family education;Functional tasks;Cueing hierarchy;SLP instruction and feedback;Internal/external aids   Potential to Achieve Goals Good      Patient will benefit from skilled therapeutic intervention in order to improve the following deficits and impairments:    Other voice and resonance disorders (CODE)    Problem List Patient Active Problem List   Diagnosis Date Noted  . Irritable larynx 07/27/2016  . Chronic cough 07/27/2016  . Essential hypertension 07/04/2015  . Obesity 03/23/2015  . Cough variant asthma 11/26/2014  . Upper airway cough syndrome 11/25/2014    [facility] 08/16/2016, 12:18 PM   [facility] [address] [place], [place], [address] Phone: [phone]   Fax:  [phone]  Name: Cassidy Walton MRN: [id number] Date of Birth: [birth year]

## 2016-08-16 NOTE — Patient Instructions (Signed)
Handout for vocal cord dysfunction (VCD) by [employer] via paper copy

## 2016-08-24 ENCOUNTER — Ambulatory Visit: Payer: BLUE CROSS/BLUE SHIELD | Admitting: Internal Medicine

## 2016-08-28 ENCOUNTER — Ambulatory Visit: Payer: BLUE CROSS/BLUE SHIELD | Admitting: Speech Pathology

## 2016-09-03 ENCOUNTER — Encounter: Payer: Self-pay | Admitting: Internal Medicine

## 2016-09-03 ENCOUNTER — Ambulatory Visit (INDEPENDENT_AMBULATORY_CARE_PROVIDER_SITE_OTHER): Payer: BLUE CROSS/BLUE SHIELD | Admitting: Internal Medicine

## 2016-09-03 DIAGNOSIS — J387 Other diseases of larynx: Secondary | ICD-10-CM

## 2016-09-03 DIAGNOSIS — R05 Cough: Secondary | ICD-10-CM | POA: Diagnosis not present

## 2016-09-03 DIAGNOSIS — R053 Chronic cough: Secondary | ICD-10-CM

## 2016-09-03 DIAGNOSIS — J45991 Cough variant asthma: Secondary | ICD-10-CM | POA: Diagnosis not present

## 2016-09-03 MED ORDER — PREDNISONE 10 MG PO TABS: ORAL_TABLET | ORAL | 0 refills | Status: DC

## 2016-09-03 MED ORDER — ALBUTEROL SULFATE HFA 108 (90 BASE) MCG/ACT IN AERS: 2.0000 | INHALATION_SPRAY | Freq: Four times a day (QID) | RESPIRATORY_TRACT | 5 refills | Status: DC | PRN

## 2016-09-03 NOTE — Assessment & Plan Note (Signed)
This might be the biggest component or only component of your cough Respect decision to hold off gabapentin due to side effect concrn Glad voice rest helped you  Plan Recommend perioidic voice rest and use of sugarless lozenges and drinking water to control cough
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
## 2016-09-03 NOTE — Assessment & Plan Note (Signed)
As in other 2 sections Continue acid reflux measures for now In future will try to de-escalate If cough is still bothersome months from now, recommend cough research trial

## 2016-09-12 DIAGNOSIS — R011 Cardiac murmur, unspecified: Secondary | ICD-10-CM | POA: Diagnosis not present

## 2016-09-12 DIAGNOSIS — I2510 Atherosclerotic heart disease of native coronary artery without angina pectoris: Secondary | ICD-10-CM | POA: Diagnosis not present

## 2016-09-12 DIAGNOSIS — J45991 Cough variant asthma: Secondary | ICD-10-CM | POA: Diagnosis not present

## 2016-09-13 ENCOUNTER — Ambulatory Visit: Payer: BLUE CROSS/BLUE SHIELD | Attending: Internal Medicine

## 2016-09-13 DIAGNOSIS — R498 Other voice and resonance disorders: Secondary | ICD-10-CM | POA: Diagnosis not present

## 2016-09-17 NOTE — Therapy (Signed)
[facility] [address] [place], [place], [address] Phone: [phone]   Fax:  [phone]  Speech Language Pathology Treatment  Patient Details  Name: Cassidy Walton MRN: [id number] Date of Birth: 1961-04-14 Referring Provider: Kalman Shan, MD  Encounter Date: 09/13/2016      End of Session - 09/17/16 1404    Visit Number 2   Number of Visits 8   Date for SLP Re-Evaluation 10/19/16   Activity Tolerance Patient tolerated treatment well      Past Medical History:  Diagnosis Date  . Abnormal pap    CIN III  . Environmental allergies 04/19/15  . Heart murmur   . History of polymyalgia rheumatica   . Spastic colon    hx of Spastic colon    Past Surgical History:  Procedure Laterality Date  . CHOLECYSTECTOMY      There were no vitals filed for this visit.             ADULT SLP TREATMENT - 09/17/16 0001      General Information   Behavior/Cognition Alert;Cooperative;Pleasant mood     Treatment Provided   Treatment provided Cognitive-Linquistic     Cognitive-Linquistic Treatment   Treatment focused on Voice   Skilled Treatment Pt reports being very encouraged about reduction in cough frequency after she took extended (2-3 day) voice rest. She focused on abdominal breathing for conversations as much as possible since last session. In conversation today pt maintained abdominal breathing (AB) with rare min A to do so over 10 mintues, twice. AB at rest was WNL.     Assessment / Recommendations / Plan   Plan Continue with current plan of care     Progression Toward Goals   Progression toward goals Progressing toward goals            SLP Short Term Goals - 09/17/16 1402      SLP SHORT TERM GOAL #1   Title pt will demo abdominal breathing (AB) in sentence response tasks 19/20 over two sessions   Time 3   Period Weeks   Status On-going     SLP SHORT TERM GOAL #2   Title pt will report  decr of at least 85% (total) of overt s/s VCD   Status Achieved     SLP SHORT TERM GOAL #3   Title pt will report using relaxation imagery for purpose of combat VCD symptoms, between 3 therapy sessions   Time 3   Period Weeks   Status On-going          SLP Long Term Goals - 09/17/16 1403      SLP LONG TERM GOAL #1   Title pt will reprot total decrease of overt s/s VCD of at least 90%   Time 7   Period Weeks   Status On-going     SLP LONG TERM GOAL #2   Title pt will use abdominal breathing 80% of the time in 10 mintues of mod complex conversation over three sessions   Time 7   Period Weeks   Status Revised          Plan - 09/17/16 1400    Clinical Impression Statement Pt with good to excellent success when using abdominal breathing (AB) in conversation today. Reports using relaxation strategies except sniff-blow due to unable to have adequate breath support for exhale. Coughed twice in 40 minute session today. Skilled ST remains needed to ensure consistnecy with success.   Speech Therapy  Frequency 2x / week   Duration --  8 weeks   Treatment/Interventions Compensatory strategies;Patient/family education;Functional tasks;Cueing hierarchy;SLP instruction and feedback;Internal/external aids   Potential to Achieve Goals Good      Patient will benefit from skilled therapeutic intervention in order to improve the following deficits and impairments:   Other voice and resonance disorders (CODE)    Problem List Patient Active Problem List   Diagnosis Date Noted  . Irritable larynx 07/27/2016  . Chronic cough 07/27/2016  . Essential hypertension 07/04/2015  . Obesity 03/23/2015  . Cough variant asthma 11/26/2014    [facility] ,MS, CCC-SLP  09/17/2016, 2:04 PM  [facility] [address] [place], [place], [address] Phone: [phone]   Fax:  [phone]   Name: Cassidy Walton MRN: [id number] Date of  Birth: 1961/04/12

## 2016-09-25 ENCOUNTER — Encounter: Payer: Self-pay | Admitting: Nurse Practitioner

## 2016-09-26 ENCOUNTER — Encounter: Payer: Self-pay | Admitting: Nurse Practitioner

## 2016-09-26 ENCOUNTER — Ambulatory Visit (INDEPENDENT_AMBULATORY_CARE_PROVIDER_SITE_OTHER): Payer: BLUE CROSS/BLUE SHIELD | Admitting: Nurse Practitioner

## 2016-09-26 ENCOUNTER — Ambulatory Visit: Payer: BLUE CROSS/BLUE SHIELD | Attending: Internal Medicine

## 2016-09-26 VITALS — BP 100/66 | HR 68 | Ht 62.0 in | Wt 183.0 lb

## 2016-09-26 DIAGNOSIS — Z124 Encounter for screening for malignant neoplasm of cervix: Secondary | ICD-10-CM | POA: Diagnosis not present

## 2016-09-26 DIAGNOSIS — N952 Postmenopausal atrophic vaginitis: Secondary | ICD-10-CM | POA: Diagnosis not present

## 2016-09-26 DIAGNOSIS — R87619 Unspecified abnormal cytological findings in specimens from cervix uteri: Secondary | ICD-10-CM

## 2016-09-26 DIAGNOSIS — I2510 Atherosclerotic heart disease of native coronary artery without angina pectoris: Secondary | ICD-10-CM | POA: Diagnosis not present

## 2016-09-26 DIAGNOSIS — Z0000 Encounter for general adult medical examination without abnormal findings: Secondary | ICD-10-CM | POA: Diagnosis not present

## 2016-09-26 DIAGNOSIS — R498 Other voice and resonance disorders: Secondary | ICD-10-CM | POA: Insufficient documentation

## 2016-09-26 DIAGNOSIS — Z01411 Encounter for gynecological examination (general) (routine) with abnormal findings: Secondary | ICD-10-CM

## 2016-09-26 DIAGNOSIS — I10 Essential (primary) hypertension: Secondary | ICD-10-CM | POA: Diagnosis not present

## 2016-09-26 DIAGNOSIS — Z1151 Encounter for screening for human papillomavirus (HPV): Secondary | ICD-10-CM | POA: Diagnosis not present

## 2016-09-26 NOTE — Progress Notes (Signed)
56 y.o. G0P0000 Single  Caucasian Fe here for annual exam.  New problems this year has been related to laryngeal spasm and neuropathy.  She then had a CT scan of lungs 08/01/16 and that is showing 'aortic atherosclerosis and 2 vessel arthrosclerosis'.  She is having cardio eval done next week.  Patient's last menstrual period was 03/24/2011.          Sexually active: Yes.    The current method of family planning is post menopausal status.    Exercising: Yes.    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 Her smoking use included Cigarettes. She has never used smokeless tobacco. She reports that she drinks about 3.0 - 3.6 oz of alcohol per week . She reports that she does not use drugs.  Past Medical History:  Diagnosis Date  . Abnormal pap    CIN III  . Environmental allergies 04/19/15  . Heart murmur   . History of polymyalgia rheumatica   . Recurrent laryngeal neuropathy 2017  . Spastic colon    hx of Spastic colon    Past Surgical History:  Procedure Laterality Date  . CHOLECYSTECTOMY      Current Outpatient Prescriptions  Medication Sig Dispense Refill  . albuterol (PROAIR HFA) 108 (90 Base) MCG/ACT inhaler Inhale 2 puffs into the lungs every 6 (six) hours as needed for wheezing or shortness of breath. 2 Inhaler 5  . aspirin EC 81 MG tablet Take 81 mg by mouth daily.    Marland Kitchen b complex vitamins tablet Take 1 tablet by mouth daily.    . Calcium Carbonate (CALCIUM 600 PO) Take 2 tablets by mouth daily.    . chlorpheniramine (CHLOR-TRIMETON) 4 MG tablet Take 4 mg by mouth at bedtime.    . Ergocalciferol (VITAMIN  D2) 2000 UNITS TABS Take 1 tablet by mouth daily.    . Estradiol (VAGIFEM) 10 MCG TABS vaginal tablet Use every night before bed for 2 wks then twice a week 18 tablet 12  . famotidine (PEPCID) 10 MG tablet Take 10 mg by mouth every morning.    . fluticasone (FLONASE) 50 MCG/ACT nasal spray Place 2 sprays into both nostrils daily.    . hydroxychloroquine (PLAQUENIL) 200 MG tablet Take 200 mg by mouth 2 (two) times daily.    . meloxicam (MOBIC) 7.5 MG tablet Take 7.5 mg by mouth daily as needed for pain.    . Multiple Vitamin (MULTIVITAMIN) capsule Take 1 capsule by mouth daily.    Marland Kitchen omeprazole (PRILOSEC OTC) 20 MG tablet Take 20 mg by mouth at bedtime.    Marland Kitchen Respiratory Therapy Supplies (FLUTTER) DEVI Use as directed 1 each 0  . rosuvastatin (CRESTOR) 10 MG tablet Take 1 tablet by mouth at bedtime.  0  . SYMBICORT 80-4.5 MCG/ACT inhaler INHALE 2 PUFFS FIRST THING IN THE AM AND THEN ANOTHER 2 PUFFS ABOUT 12 HOURS LATER 10.2 g 5  . traMADol (ULTRAM) 50 MG tablet 1-2 every 4 hours as needed for cough or pain 40 tablet 0  . valACYclovir (VALTREX) 1000 MG tablet Take 2 g  by mouth as needed.     No current facility-administered medications for this visit.     Family History  Problem Relation Age of Onset  . Heart failure Father   . Osteoarthritis Mother   . Hypertension Mother   . Obesity Sister   . Osteoarthritis Sister   . Osteoarthritis Brother   . Stroke Maternal Grandmother   . Heart disease Maternal Grandmother   . Hypertension Maternal Grandmother   . Stroke Paternal Grandmother   . Heart disease Paternal Grandmother   . Allergies      "everyone"    ROS:  Pertinent items are noted in HPI.  Otherwise, a comprehensive ROS was negative.  Exam:   BP 100/66 (BP Location: Left Arm, Patient Position: Sitting, Cuff Size: Large)   Pulse 68   Ht  (1.575 m)   Wt 183 lb (83 kg)   LMP 03/24/2011   BMI 33.47 kg/m  Height:  (157.5 cm) Ht Readings from Last 3 Encounters:   09/26/16  (1.575 m)  09/03/16  (1.575 m)  07/27/16  (1.575 m)    General appearance: alert, cooperative and appears stated age Head: Normocephalic, without obvious abnormality, atraumatic Neck: no adenopathy, supple, symmetrical, trachea midline and thyroid normal to inspection and palpation Lungs: clear to auscultation bilaterally Breasts: normal appearance, no masses or tenderness Heart: regular rate and rhythm Abdomen: soft, non-tender; no masses,  no organomegaly Extremities: extremities normal, atraumatic, no cyanosis or edema Skin: Skin color, texture, turgor normal. No rashes or lesions Lymph nodes: Cervical, supraclavicular, and axillary nodes normal. No abnormal inguinal nodes palpated Neurologic: Grossly normal   Pelvic: External genitalia:  no lesions              Urethra:  normal appearing urethra with no masses, tenderness or lesions              Bartholin's and Skene's: normal                 Vagina: normal appearing vagina with normal color and discharge, no lesions              Cervix: anteverted              Pap taken: Yes.   Bimanual Exam:  Uterus:  normal size, contour, position, consistency, mobility, non-tender              Adnexa: no mass, fullness, tenderness               Rectovaginal: Confirms               Anus:  normal sphincter tone, no lesions  Chaperone present: yes  A:  Well Woman with normal exam  Postmenopausal no HRT             History of Polymyalgia Rheumatica             Remote history of CIN III 12/1989 - normal since             History of hypercholesterolemia             HTN, coronary atherosclerosis             Dyspareunia with vaginal atrophy               P:   Reviewed health and wellness pertinent to exam  Pap smear was done  Mammogram is due 09/2016  After cardio evaluation will decide if vaginal estrogen is  OK per cardiologist  Counseled on breast self exam, mammography screening, adequate intake of calcium and  vitamin D, diet and exercise, Kegel's exercises return annually or prn  An After Visit Summary was printed and given to the patient.

## 2016-09-26 NOTE — Patient Instructions (Addendum)

## 2016-09-26 NOTE — Therapy (Signed)
[facility] [address] [place], [place], [address] Phone: [phone]   Fax:  [phone]  Speech Language Pathology Treatment  Patient Details  Name: MARIANNE GOLIGHTLY MRN: [id number] Date of Birth: 1960/09/03 Referring Provider: Brand Males, MD  Encounter Date: 09/26/2016      End of Session - 09/26/16 1432    Visit Number 3   Number of Visits 8   Date for SLP Re-Evaluation 10/19/16   SLP Start Time 1404   SLP Stop Time  1426   SLP Time Calculation (min) 22 min   Activity Tolerance Patient tolerated treatment well      Past Medical History:  Diagnosis Date  . Abnormal pap    CIN III  . Environmental allergies 04/19/15  . Heart murmur   . History of polymyalgia rheumatica   . Recurrent laryngeal neuropathy 2017  . Spastic colon    hx of Spastic colon    Past Surgical History:  Procedure Laterality Date  . CHOLECYSTECTOMY      There were no vitals filed for this visit.      Subjective Assessment - 09/26/16 1410    Subjective Pt reports no difficulties with coughing or breathing.   Currently in Pain? No/denies               ADULT SLP TREATMENT - 09/26/16 1412      General Information   Behavior/Cognition Alert;Cooperative;Pleasant mood     Treatment Provided   Treatment provided Cognitive-Linquistic     Cognitive-Linquistic Treatment   Treatment focused on Voice   Skilled Treatment "Those imagery things really help me." Pt reports that she had cleared her throat twice today but has allergies. She is breathing abdominally (AB) with exercise. Coughed once while in [place] room.  SLP walked with pt around clinic talking for 10 minutes and pt used AB 100% of the time.      Assessment / Recommendations / Plan   Plan Discharge SLP treatment due to (comment)  met goals/pt agrees     Progression Toward Goals   Progression toward goals --  see goal update            SLP Short Term  Goals - 09/26/16 1434      SLP SHORT TERM GOAL #1   Title pt will demo abdominal breathing (AB) in sentence response tasks 19/20 over two sessions   Status Achieved     SLP SHORT TERM GOAL #2   Title pt will report decr of at least 85% (total) of overt s/s VCD   Status Achieved     SLP SHORT TERM GOAL #3   Title pt will report using relaxation imagery for purpose of combat VCD symptoms, between 3 therapy sessions   Status Achieved          SLP Long Term Goals - 09/26/16 1438      SLP LONG TERM GOAL #1   Title pt will reprot total decrease of overt s/s VCD of at least 90%   Status Achieved     SLP LONG TERM GOAL #2   Title pt will use abdominal breathing 80% of the time in 10 mintues of mod complex conversation over three sessions   Status Partially Met  2 visits          Plan - 09/26/16 1433    Clinical Impression Statement Pt with excellent success when using abdominal breathing (AB) in conversation today, and when walking in clinic with talking.  Cont to report using relaxation strategies. Coughed once in 30 minute session today. D/c is appropriate and pt agrees.   Treatment/Interventions Compensatory strategies;Patient/family education;Functional tasks;Cueing hierarchy;SLP instruction and feedback;Internal/external aids   Potential to Achieve Goals Good      SPEECH THERAPY DISCHARGE SUMMARY  Visits from Start of Care: 3  Current functional level related to goals / functional outcomes: Pt reports rare coughing/throat clearing in last 3 weeks due to tickle in throat. Coughed once in 30 minute session today.   Remaining deficits: None   Education / Equipment: Relaxation strategies, abdominal breathing.  Plan: Patient agrees to discharge.  Patient goals were partially met. Patient is being discharged due to meeting the stated rehab goals.  ?????       Patient will benefit from skilled therapeutic intervention in order to improve the following deficits and  impairments:   Other voice and resonance disorders (CODE)    Problem List Patient Active Problem List   Diagnosis Date Noted  . Irritable larynx 07/27/2016  . Chronic cough 07/27/2016  . Essential hypertension 07/04/2015  . Obesity 03/23/2015  . Cough variant asthma 11/26/2014    [facility] ,MS, [place]  09/26/2016, 2:39 PM  [facility] [address] [place], [place], [address] Phone: [phone]   Fax:  [phone]   Name: MONICE LUNDY MRN: [id number] Date of Birth: 03-11-1961

## 2016-09-27 NOTE — Progress Notes (Signed)
Encounter reviewed by Dr. Brook Amundson C. Silva.  

## 2016-10-01 LAB — IPS PAP TEST WITH HPV

## 2016-10-01 NOTE — Addendum Note (Signed)
Addended by: Roanna Banning on: 10/01/2016 05:22 PM   Modules accepted: Orders

## 2016-10-02 DIAGNOSIS — I2510 Atherosclerotic heart disease of native coronary artery without angina pectoris: Secondary | ICD-10-CM | POA: Diagnosis not present

## 2016-10-02 DIAGNOSIS — R0989 Other specified symptoms and signs involving the circulatory and respiratory systems: Secondary | ICD-10-CM | POA: Diagnosis not present

## 2016-10-02 DIAGNOSIS — R011 Cardiac murmur, unspecified: Secondary | ICD-10-CM | POA: Diagnosis not present

## 2016-10-02 LAB — IPS HPV GENOTYPING 16/18

## 2016-10-05 DIAGNOSIS — I2510 Atherosclerotic heart disease of native coronary artery without angina pectoris: Secondary | ICD-10-CM | POA: Diagnosis not present

## 2016-10-09 DIAGNOSIS — E7889 Other lipoprotein metabolism disorders: Secondary | ICD-10-CM | POA: Diagnosis not present

## 2016-10-09 DIAGNOSIS — R0989 Other specified symptoms and signs involving the circulatory and respiratory systems: Secondary | ICD-10-CM | POA: Diagnosis not present

## 2016-10-09 DIAGNOSIS — I2510 Atherosclerotic heart disease of native coronary artery without angina pectoris: Secondary | ICD-10-CM | POA: Diagnosis not present

## 2016-10-09 DIAGNOSIS — R011 Cardiac murmur, unspecified: Secondary | ICD-10-CM | POA: Diagnosis not present

## 2016-11-08 DIAGNOSIS — Z79899 Other long term (current) drug therapy: Secondary | ICD-10-CM | POA: Diagnosis not present

## 2016-11-13 ENCOUNTER — Encounter: Payer: Self-pay | Admitting: Internal Medicine

## 2016-11-13 ENCOUNTER — Ambulatory Visit (INDEPENDENT_AMBULATORY_CARE_PROVIDER_SITE_OTHER): Payer: BLUE CROSS/BLUE SHIELD | Admitting: Internal Medicine

## 2016-11-13 ENCOUNTER — Other Ambulatory Visit: Payer: Self-pay | Admitting: Nurse Practitioner

## 2016-11-13 VITALS — BP 124/64 | HR 66 | Ht 63.0 in | Wt 185.0 lb

## 2016-11-13 DIAGNOSIS — R05 Cough: Secondary | ICD-10-CM | POA: Diagnosis not present

## 2016-11-13 DIAGNOSIS — J387 Other diseases of larynx: Secondary | ICD-10-CM | POA: Diagnosis not present

## 2016-11-13 DIAGNOSIS — R053 Chronic cough: Secondary | ICD-10-CM

## 2016-11-13 NOTE — Patient Instructions (Addendum)
ICD-9-CM ICD-10-CM   1. Chronic cough 786.2 R05   2. Irritable larynx 478.79 J38.7     Glad you are much better Ok to stop prilosec first for 4 weeks and then pepcid all togther Continue symbicort for now esp with belief it helps you Continue nasal steroid spray and OTC chlorpheniramine  Followup 3 months  - RSI cough score at followup  - feno test at followup to decide if we can stop symbicort
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
## 2016-11-13 NOTE — Telephone Encounter (Signed)
Medication refill request: Yuvafem  Last AEX:  09/26/16 PG Next AEX: 10/02/17 PG Last MMG (if hormonal medication request): 10/04/15 [id number]:Neg  Refill authorized: 10/24/15 #[id number]. Today please advise.

## 2016-12-05 DIAGNOSIS — R21 Rash and other nonspecific skin eruption: Secondary | ICD-10-CM | POA: Diagnosis not present

## 2016-12-05 DIAGNOSIS — M542 Cervicalgia: Secondary | ICD-10-CM | POA: Diagnosis not present

## 2016-12-05 DIAGNOSIS — M1990 Unspecified osteoarthritis, unspecified site: Secondary | ICD-10-CM | POA: Diagnosis not present

## 2016-12-05 DIAGNOSIS — E559 Vitamin D deficiency, unspecified: Secondary | ICD-10-CM | POA: Diagnosis not present

## 2016-12-06 ENCOUNTER — Other Ambulatory Visit: Payer: Self-pay | Admitting: Emergency Medicine

## 2016-12-06 MED ORDER — BUDESONIDE-FORMOTEROL FUMARATE 80-4.5 MCG/ACT IN AERO: 2.0000 | INHALATION_SPRAY | Freq: Two times a day (BID) | RESPIRATORY_TRACT | 3 refills | Status: DC

## 2016-12-14 ENCOUNTER — Telehealth: Payer: Self-pay | Admitting: Nurse Practitioner

## 2016-12-14 NOTE — Telephone Encounter (Signed)
Patient called back and said the heart doctor sent the records on Monday. She was not sure if they were mailed or faxed.

## 2016-12-14 NOTE — Telephone Encounter (Signed)
Spoke with patient, advised as seen below per Ria CommentPatricia Grubb, NP. Patient reports records faxed on Monday from Dr. Jacinto HalimGanji -Cardiologist. Advised RN will f/u and return call on Monday.

## 2016-12-14 NOTE — Telephone Encounter (Signed)
Patient called back to report the records were faxed on Monday, 12/10/16.

## 2016-12-14 NOTE — Telephone Encounter (Signed)
I had looked this am and just now and have not gotten any cardio notes.

## 2016-12-14 NOTE — Telephone Encounter (Signed)
1. Patient is requesting a prescription for "Uvafem" be into [facility] on [address].  2. Patient wants to be sure we've received the results/records from her heart doctor. She'd like an answer about this today as "many offices are closed next week." She said she'll drive over and get the records from them if we've not gotten them yet.

## 2016-12-14 NOTE — Telephone Encounter (Signed)
Ria CommentPatricia Grubb, NP -have you received and reviewed records from cardiology?

## 2016-12-17 NOTE — Telephone Encounter (Signed)
Spoke with Mardella LaymanLindsey. Was advised fax number incorrect, will re-fax records to [phone], David StallATTN Nakeem Murnane H.

## 2016-12-17 NOTE — Telephone Encounter (Signed)
Spoke with patient, advised once records received will have Ria CommentPatricia Grubb, NP review and advise on prescription for Yuvafem. Patient verbalizes understanding and is agreeable.

## 2016-12-18 NOTE — Telephone Encounter (Signed)
Spoke with patient. Advised patient have not received Cardiologist records to date. Patient will f/u with Dr. Verl DickerGanji's office.

## 2016-12-18 NOTE — Telephone Encounter (Signed)
Spoke with patient, advised records received, placed on Cassidy CommentPatricia Grubb, NP desk for review. Will return call with recommendations. Patient thankful and verbalizes understanding.  Cassidy CommentPatricia Grubb, NP -cardiology records placed on desk for review and advise on RX.

## 2016-12-26 NOTE — Telephone Encounter (Signed)
Staff message sent to patient's cardiologist regarding use of Vagifem.  Will await response.

## 2016-12-28 NOTE — Telephone Encounter (Signed)
Left message to call Noreene LarssonJill at [phone].   Dr. Edward JollySilva - patient previously requested refill on Yuvafem vaginal tablets, will this be changing to vaginal estrogen cream?  Ok to fill until next AEX? Please advise?

## 2016-12-28 NOTE — Telephone Encounter (Signed)
Please update the patient on the use of Vagifem.   I have contacted Dr. Jacinto HalimGanji, her cardiologist.  He has approved the use of the Vagifem from a cardiovascular standpoint.  What I have noticed is that we have no mammogram on record for her for 2018.  She needs to update her mammogram prior to me prescribing the Vagifem.   Thank you.   RE: Use of local vaginal estrogen cream  Received: Yesterday  Message Contents  Yates DecampGanji, Jay, MD  Patton SallesAmundson C Silva, Cassidy E, MD        [facility] Cassidy,  I have no problem in this and is very appropriate. Please do not hesitate to ever contact me on my cell either if you have questions and can always text me to call you back. Which ever is easy.     Yates DecampGANJI, JAY, MD  12/27/2016, 10:10 PM  [facility]  Office: [phone]  Cell: [phone]   Previous Messages    ----- Message -----  From: Patton SallesAmundson C Silva, Cassidy E, MD  Sent: 12/26/2016  8:43 PM  To: Yates DecampJay Ganji, MD  Subject: Use of local vaginal estrogen cream        Hello Dr. Jacinto HalimGanji,   I am taking over the gynecology care of Cassidy Walton, a mutual patient. My colleague Shirlyn GoltzPatty Grubb, FNP, newly retired, wanted to check about the use of local vaginal estrogen treatment for this patient.   Please let me know if it is acceptable from a cardiovascular standpoint.   This is not estrogen replacement therapy, but rather is a for treatment of postmenopausal atrophy.   Thank you,   Conley SimmondsBrook Silva, MD  [facility]

## 2016-12-28 NOTE — Telephone Encounter (Addendum)
-----   Message from Patton SallesBrook E Amundson C Silva, MD sent at 12/28/2016  4:11 AM EDT ----- Regarding: FW: Use of local vaginal estrogen cream Please let patient know that cardiology has approved the use of local vaginal estrogen.  Let me know if she needs a refill of the Vagifem.   Please place a copy of this staff message in her telephone note.  Thank you,   [facility]

## 2016-12-28 NOTE — Telephone Encounter (Signed)
Please have patient update her mammogram if it has not been done this year. Will need to do before prescribing vaginal estrogens.  You may close this encounter after letting her know about this.

## 2017-01-01 ENCOUNTER — Other Ambulatory Visit: Payer: Self-pay | Admitting: Obstetrics and Gynecology

## 2017-01-01 ENCOUNTER — Ambulatory Visit
Admission: RE | Admit: 2017-01-01 | Discharge: 2017-01-01 | Disposition: A | Discharge status: Home / Self Care | Payer: BLUE CROSS/BLUE SHIELD | Source: Ambulatory Visit | Attending: Obstetrics and Gynecology | Admitting: Obstetrics and Gynecology

## 2017-01-01 DIAGNOSIS — Z8349 Family history of other endocrine, nutritional and metabolic diseases: Secondary | ICD-10-CM | POA: Diagnosis not present

## 2017-01-01 DIAGNOSIS — Z1231 Encounter for screening mammogram for malignant neoplasm of breast: Secondary | ICD-10-CM

## 2017-01-01 DIAGNOSIS — Z5181 Encounter for therapeutic drug level monitoring: Secondary | ICD-10-CM | POA: Diagnosis not present

## 2017-01-01 NOTE — Telephone Encounter (Signed)
Spoke with patient, advised as seen below per Dr. Edward JollySilva. Patient expressed frustration on length of time taken for cardiology response and prescription. Offered assistance with scheduling MMG, patient declined. Patient apologetic for frustration, thankful for assistance and verbalizes understanding.   Patient is agreeable to disposition. Will close encounter.

## 2017-01-01 NOTE — Telephone Encounter (Signed)
Patient is returning a call to Cassidy Walton regarding her prescription.

## 2017-01-01 NOTE — Telephone Encounter (Signed)
Left message to call Ressie Slevin at [phone].  

## 2017-01-02 ENCOUNTER — Telehealth: Payer: Self-pay | Admitting: *Deleted

## 2017-01-02 MED ORDER — ESTRADIOL 10 MCG VA TABS: ORAL_TABLET | VAGINAL | 8 refills | Status: DC

## 2017-01-02 NOTE — Telephone Encounter (Signed)
Rx for vagifem to pharmacy on file.   Call to patient, left detailed message, ok per current dpr. Advised requested [facility] on [address]. Return call to office for any additional questions.   Will close encounter.

## 2017-01-02 NOTE — Telephone Encounter (Signed)
Call transferred from from office staff. Patient calling to notify Dr. Edward JollySilva screening MMG completed on 01/01/17, request refill of Vagifem 10 mcg vaginal tablets. Advised patient would review with Dr. Edward JollySilva and return call with recommendations. Patient is agreeable.   01/01/17 MMG at [facility]  -Bi-rads 1-negative, screening MMG in one year.   Dr. Edward JollySilva- please advise on refill of Vagifem?

## 2017-01-02 NOTE — Telephone Encounter (Signed)
Ok for Vagifem 10 mcg pv at hs for 2 weeks.  Then Vagifem 10 mcg pv at hs twice weekly.  Dispense:  18, RF 8 tabs each until annual exam is due.   Thank you!

## 2017-02-07 ENCOUNTER — Ambulatory Visit (INDEPENDENT_AMBULATORY_CARE_PROVIDER_SITE_OTHER): Payer: BLUE CROSS/BLUE SHIELD | Admitting: Internal Medicine

## 2017-02-07 ENCOUNTER — Encounter: Payer: Self-pay | Admitting: Internal Medicine

## 2017-02-07 VITALS — BP 116/76 | HR 66 | Ht 63.0 in | Wt 187.2 lb

## 2017-02-07 DIAGNOSIS — J387 Other diseases of larynx: Secondary | ICD-10-CM

## 2017-02-07 DIAGNOSIS — R05 Cough: Secondary | ICD-10-CM | POA: Diagnosis not present

## 2017-02-07 DIAGNOSIS — R053 Chronic cough: Secondary | ICD-10-CM

## 2017-02-07 LAB — NITRIC OXIDE: Nitric Oxide: 23

## 2017-02-07 MED ORDER — FLUTICASONE PROPIONATE 50 MCG/ACT NA SUSP: 2.0000 | Freq: Every day | NASAL | 2 refills | Status: DC

## 2017-02-07 NOTE — Addendum Note (Signed)
Addended by: Maurene Capes on: 02/07/2017 09:48 AM   Modules accepted: Orders
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
## 2017-02-07 NOTE — Addendum Note (Signed)
Addended by: Maurene Capes on: 02/07/2017 09:46 AM   Modules accepted: Orders

## 2017-02-07 NOTE — Patient Instructions (Addendum)
ICD-10-CM   1. Chronic cough R05   2. Irritable larynx J38.7     Glad you are much better and continuing to get better Glad you are off prilosec  Seems symbicort is helping ; feno test 02/07/2017 suggest likely asthma is under control  plan Ok to stop  pepcid Continue symbicort for now but try reducing to 1 puff twice daily Continue nasal steroid spray and OTC chlorpheniramine  - NURSE will refill  take generic fluticasone inhaler 2 squirts each nostril daily Respect desire to refuse flu shot   Followup - 9 months or sooner if needed

## 2017-03-27 ENCOUNTER — Ambulatory Visit
Admission: RE | Admit: 2017-03-27 | Discharge: 2017-03-27 | Disposition: A | Discharge status: Home / Self Care | Payer: BLUE CROSS/BLUE SHIELD | Source: Ambulatory Visit | Attending: Family Medicine | Admitting: Family Medicine

## 2017-03-27 ENCOUNTER — Other Ambulatory Visit: Payer: Self-pay | Admitting: Family Medicine

## 2017-03-27 DIAGNOSIS — T7840XA Allergy, unspecified, initial encounter: Secondary | ICD-10-CM | POA: Diagnosis not present

## 2017-03-27 DIAGNOSIS — T148XXA Other injury of unspecified body region, initial encounter: Secondary | ICD-10-CM

## 2017-03-27 DIAGNOSIS — R6884 Jaw pain: Secondary | ICD-10-CM | POA: Diagnosis not present

## 2017-03-27 DIAGNOSIS — S9032XA Contusion of left foot, initial encounter: Secondary | ICD-10-CM | POA: Diagnosis not present

## 2017-04-03 DIAGNOSIS — R011 Cardiac murmur, unspecified: Secondary | ICD-10-CM | POA: Diagnosis not present

## 2017-04-03 DIAGNOSIS — R0989 Other specified symptoms and signs involving the circulatory and respiratory systems: Secondary | ICD-10-CM | POA: Diagnosis not present

## 2017-04-03 DIAGNOSIS — E7841 Elevated Lipoprotein(a): Secondary | ICD-10-CM | POA: Diagnosis not present

## 2017-04-03 DIAGNOSIS — I2510 Atherosclerotic heart disease of native coronary artery without angina pectoris: Secondary | ICD-10-CM | POA: Diagnosis not present

## 2017-04-04 IMAGING — CT CT CHEST HIGH RESOLUTION W/O CM
2 of 7 series · 15 of 36 positions shown, 18 images · non-contrast
Comparison: 06/27/2016 chest radiograph.

CLINICAL DATA: Chronic cough.

EXAM:
CT CHEST WITHOUT CONTRAST
TECHNIQUE: Multidetector CT imaging of the chest was performed following the
standard protocol without intravenous contrast. High resolution
imaging of the lungs, as well as inspiratory and expiratory imaging,
was performed.

[Series 2: high resolution · axial · 0.64mm/px · z∈[-321,-59]mm · 12 of 149 slices shown, 15 images]
[im 9/149  mediastinal]
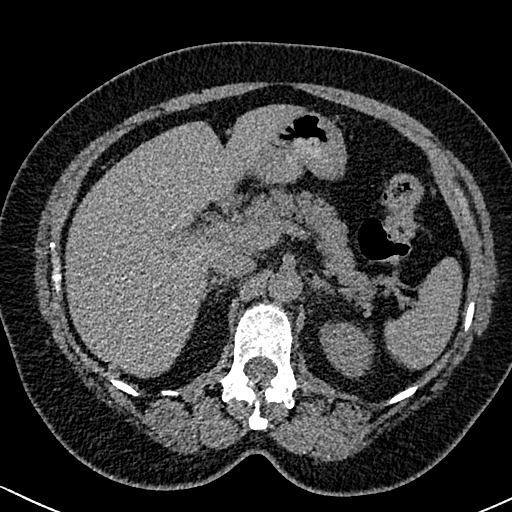
[im 9/149  lung]
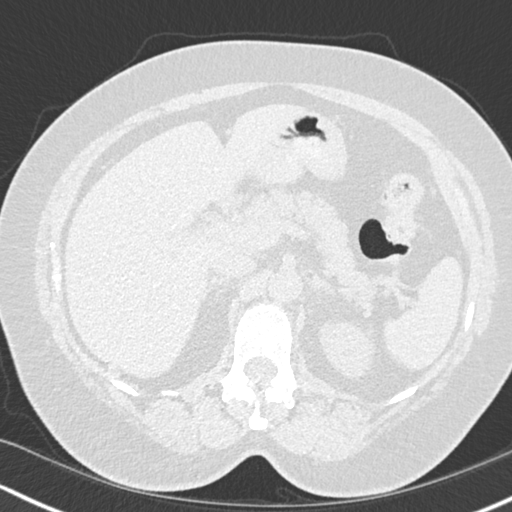
[im 27/149  lung]
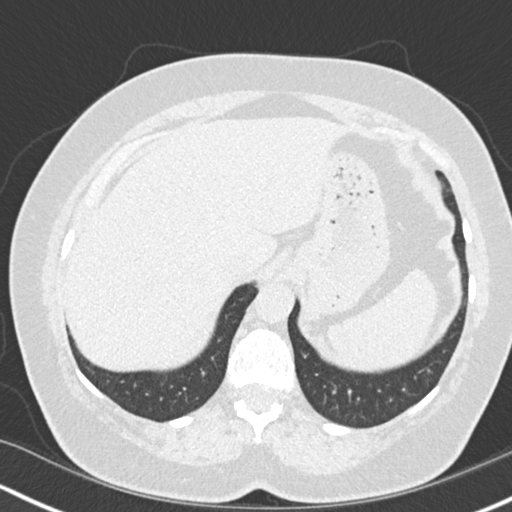
[im 35/149  lung]
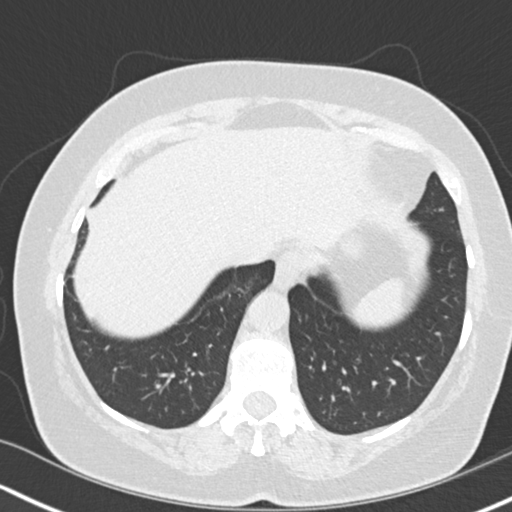
[im 44/149  lung]
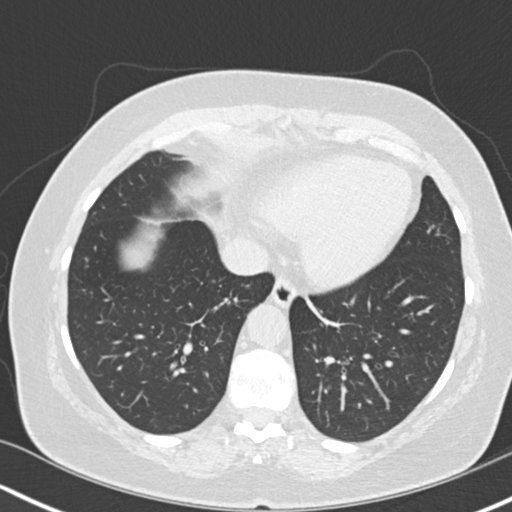
[im 61/149  mediastinal]
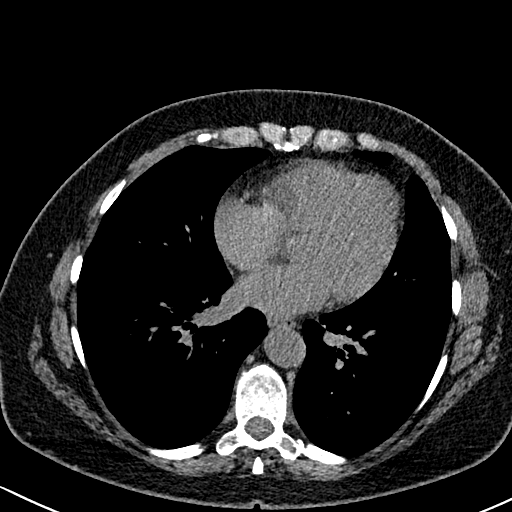
[im 61/149  lung]
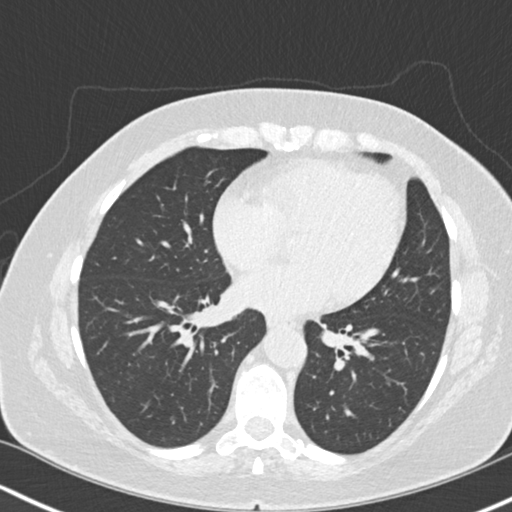
[im 70/149  lung]
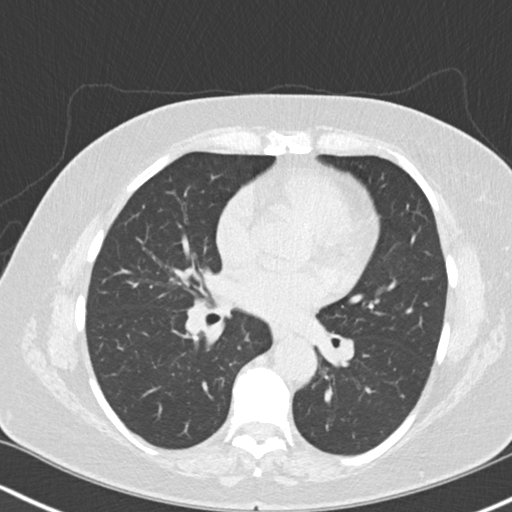
[im 79/149  lung]
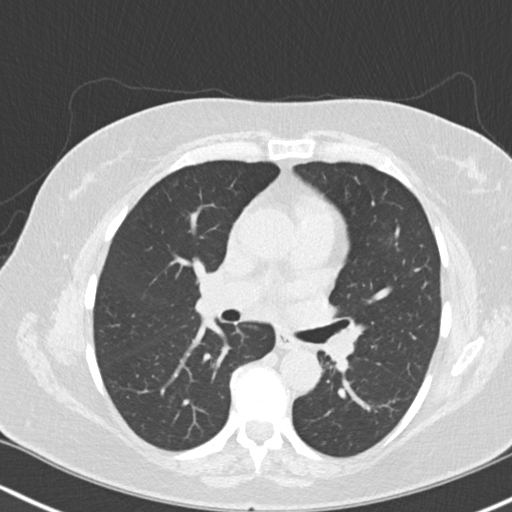
[im 96/149  lung]
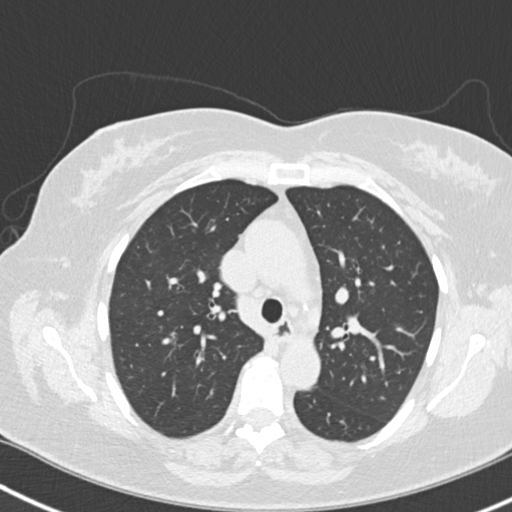
[im 105/149  mediastinal]
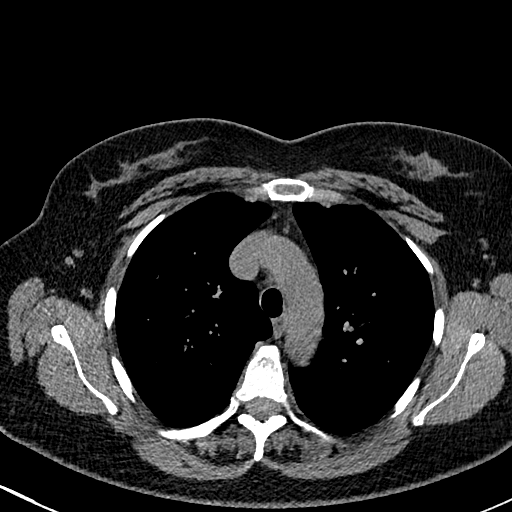
[im 105/149  lung]
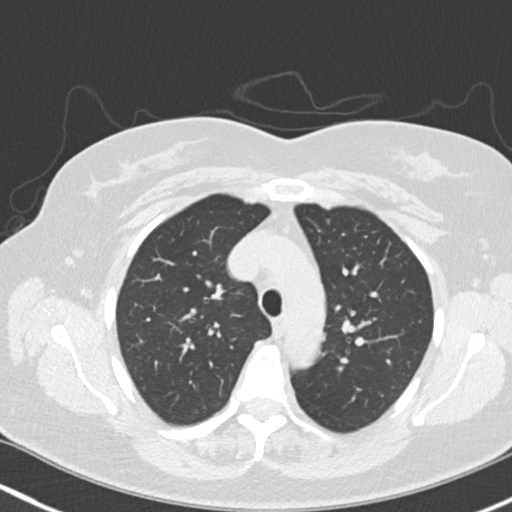
[im 114/149  lung]
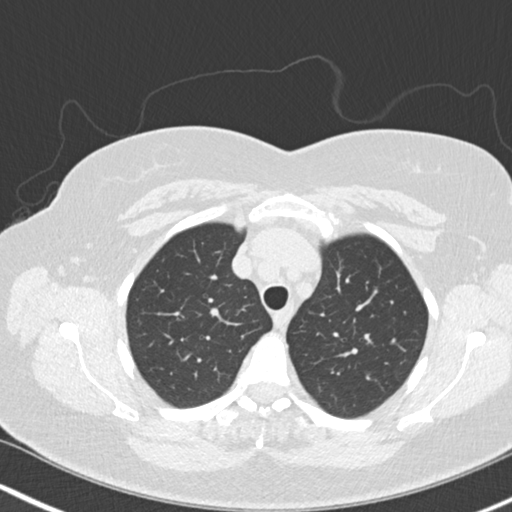
[im 131/149  lung]
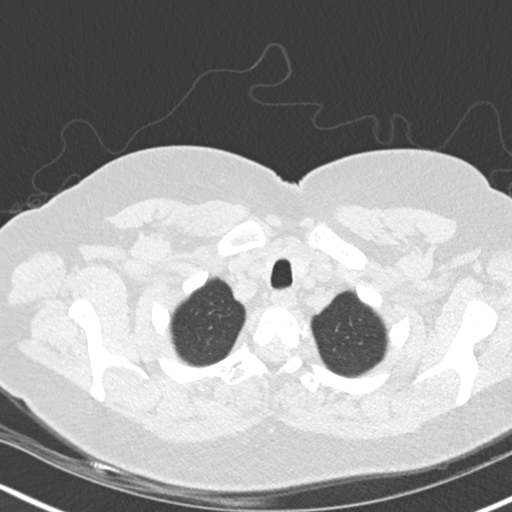
[im 140/149  lung]
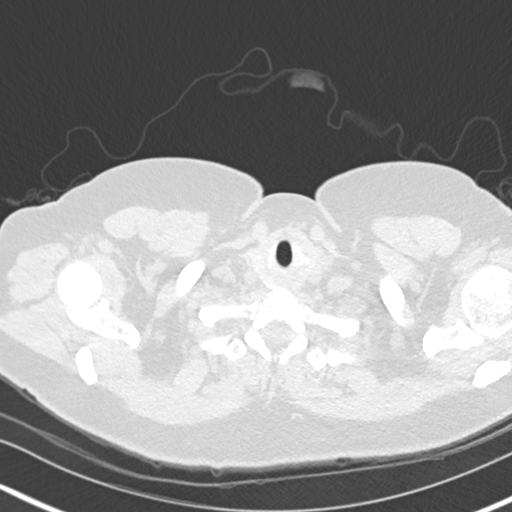

[Series 10: coronal · coronal · 0.59mm/px · 3 of 130 slices shown]
[im 26/130  lung]
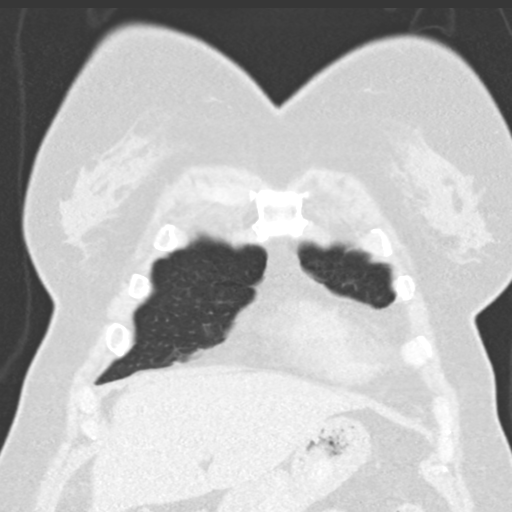
[im 52/130  lung]
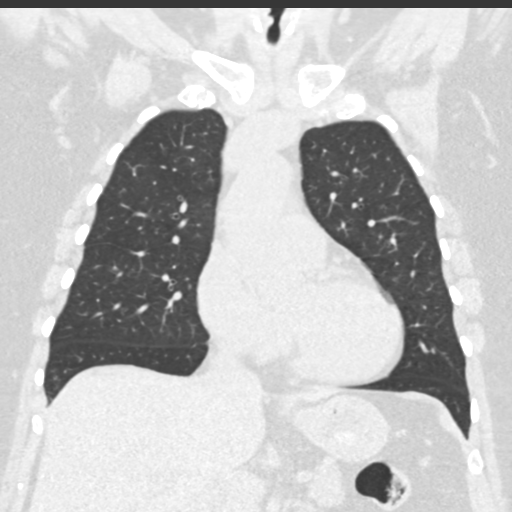
[im 78/130  lung]
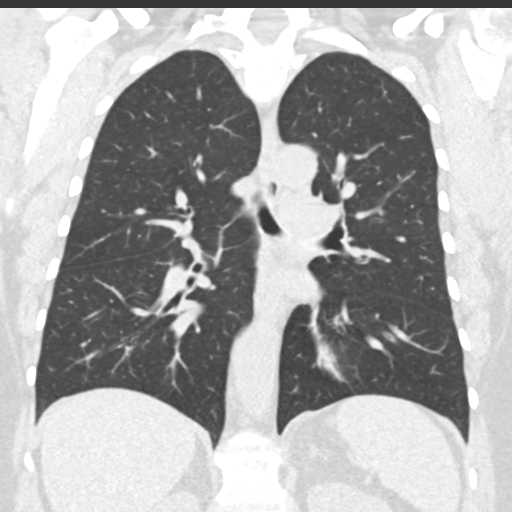

[15 of 36 positions shown; findings below may reference images not displayed]

FINDINGS: Cardiovascular: Normal heart size. No significant pericardial
fluid/thickening. Left anterior descending and right coronary
atherosclerosis. Mildly atherosclerotic and nonaneurysmal thoracic
aorta. Normal caliber pulmonary arteries.

Mediastinum/Nodes: No discrete thyroid nodules. Unremarkable
esophagus. No pathologically enlarged axillary, mediastinal or gross
hilar lymph nodes, noting limited sensitivity for the detection of
hilar adenopathy on this noncontrast study.

Lungs/Pleura: No pneumothorax. No pleural effusion. There a few
scattered solid pulmonary nodules, largest 5 mm in the perifissural
left lower lobe (series 3/ image 77). No acute consolidative
airspace disease or lung masses. No significant air trapping on the
expiration sequence. No significant regions of subpleural
reticulation, ground-glass attenuation, traction bronchiectasis,
parenchymal banding, architectural distortion or frank honeycombing.

Upper abdomen: Cholecystectomy.

Musculoskeletal: No aggressive appearing focal osseous lesions.
Moderate thoracic spondylosis.
IMPRESSION: 1. No evidence of interstitial lung disease.
2. A few scattered solid pulmonary nodules, largest 5 mm. No
follow-up needed if patient is low-risk (and has no known or
suspected primary neoplasm). Non-contrast chest CT can be considered
in 12 months if patient is high-risk. This recommendation follows
the consensus statement: Guidelines for Management of Incidental
Pulmonary Nodules Detected on CT Images: From the [HOSPITAL]
3. Aortic atherosclerosis.  Two-vessel coronary atherosclerosis.

## 2017-04-15 DIAGNOSIS — J45901 Unspecified asthma with (acute) exacerbation: Secondary | ICD-10-CM | POA: Diagnosis not present

## 2017-04-15 DIAGNOSIS — B009 Herpesviral infection, unspecified: Secondary | ICD-10-CM | POA: Diagnosis not present

## 2017-04-22 DIAGNOSIS — R6884 Jaw pain: Secondary | ICD-10-CM | POA: Diagnosis not present

## 2017-04-22 DIAGNOSIS — J4520 Mild intermittent asthma, uncomplicated: Secondary | ICD-10-CM | POA: Diagnosis not present

## 2017-04-22 DIAGNOSIS — E559 Vitamin D deficiency, unspecified: Secondary | ICD-10-CM | POA: Diagnosis not present

## 2017-04-22 DIAGNOSIS — S9032XD Contusion of left foot, subsequent encounter: Secondary | ICD-10-CM | POA: Diagnosis not present

## 2017-05-21 DIAGNOSIS — Z1211 Encounter for screening for malignant neoplasm of colon: Secondary | ICD-10-CM | POA: Diagnosis not present

## 2017-05-21 DIAGNOSIS — Z01818 Encounter for other preprocedural examination: Secondary | ICD-10-CM | POA: Diagnosis not present

## 2017-06-04 DIAGNOSIS — M1990 Unspecified osteoarthritis, unspecified site: Secondary | ICD-10-CM | POA: Diagnosis not present

## 2017-06-04 DIAGNOSIS — E559 Vitamin D deficiency, unspecified: Secondary | ICD-10-CM | POA: Diagnosis not present

## 2017-06-04 DIAGNOSIS — R21 Rash and other nonspecific skin eruption: Secondary | ICD-10-CM | POA: Diagnosis not present

## 2017-06-04 DIAGNOSIS — M542 Cervicalgia: Secondary | ICD-10-CM | POA: Diagnosis not present

## 2017-06-05 ENCOUNTER — Telehealth: Payer: Self-pay | Admitting: Internal Medicine

## 2017-06-05 DIAGNOSIS — Z1211 Encounter for screening for malignant neoplasm of colon: Secondary | ICD-10-CM | POA: Diagnosis not present

## 2017-06-05 DIAGNOSIS — D126 Benign neoplasm of colon, unspecified: Secondary | ICD-10-CM | POA: Diagnosis not present

## 2017-06-05 MED ORDER — PREDNISONE 10 MG PO TABS: ORAL_TABLET | ORAL | 0 refills | Status: DC

## 2017-06-05 NOTE — Telephone Encounter (Signed)
Called and spoke to pt. Informed her of the recs per MR. Rx sent to preferred pharmacy. Pt verbalized understanding and denied any further questions or concerns at this time.   

## 2017-06-05 NOTE — Telephone Encounter (Signed)
Spoke with [place] at [facility] to clarify pred taper.  Nothing further needed.
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
## 2017-06-05 NOTE — Telephone Encounter (Signed)
Take prednisone 40 mg daily x 2 days, then 20mg  daily x 2 days, then 10mg  daily x 2 days, then 5mg  daily x 2 days and stop  Givng pred empiric on basis this could be potential asthma getting worse  She needs to update the colonoscopy MD about this and they have to decide if they want to proceed or not  Dr. Kalman ShanMurali Yukari Walton, M.D., [facility].C.P Pulmonary and Critical Care Medicine Staff Physician, [facility] Center Director - Interstitial Lung Disease  Program  Pulmonary Fibrosis [facility] at [facility] [place], [place], [id number]  Pager: [phone], If no answer or between  15:00h - 7:00h: call [phone] Telephone: [phone]

## 2017-06-07 ENCOUNTER — Telehealth: Payer: Self-pay | Admitting: Internal Medicine

## 2017-06-07 ENCOUNTER — Encounter: Payer: Self-pay | Admitting: Internal Medicine

## 2017-06-07 ENCOUNTER — Ambulatory Visit (INDEPENDENT_AMBULATORY_CARE_PROVIDER_SITE_OTHER)
Admission: RE | Admit: 2017-06-07 | Discharge: 2017-06-07 | Disposition: A | Discharge status: Home / Self Care | Payer: BLUE CROSS/BLUE SHIELD | Source: Ambulatory Visit | Attending: Internal Medicine | Admitting: Internal Medicine

## 2017-06-07 ENCOUNTER — Ambulatory Visit: Payer: BLUE CROSS/BLUE SHIELD | Admitting: Internal Medicine

## 2017-06-07 VITALS — BP 132/70 | HR 91 | Temp 97.9°F | Ht 63.0 in | Wt 180.8 lb

## 2017-06-07 DIAGNOSIS — J45901 Unspecified asthma with (acute) exacerbation: Secondary | ICD-10-CM

## 2017-06-07 DIAGNOSIS — R05 Cough: Secondary | ICD-10-CM

## 2017-06-07 DIAGNOSIS — R059 Cough, unspecified: Secondary | ICD-10-CM

## 2017-06-07 DIAGNOSIS — J45991 Cough variant asthma: Secondary | ICD-10-CM | POA: Diagnosis not present

## 2017-06-07 DIAGNOSIS — R079 Chest pain, unspecified: Secondary | ICD-10-CM | POA: Diagnosis not present

## 2017-06-07 MED ORDER — METHYLPREDNISOLONE ACETATE 80 MG/ML IJ SUSP
80.0000 mg | Freq: Once | INTRAMUSCULAR | Status: AC
  Administered 2017-06-07: 80 mg via INTRAMUSCULAR

## 2017-06-07 MED ORDER — BENZONATATE 200 MG PO CAPS: 200.0000 mg | ORAL_CAPSULE | Freq: Three times a day (TID) | ORAL | 1 refills | Status: DC | PRN

## 2017-06-07 MED ORDER — LEVALBUTEROL HCL 0.63 MG/3ML IN NEBU
0.6300 mg | INHALATION_SOLUTION | Freq: Once | RESPIRATORY_TRACT | Status: AC
  Administered 2017-06-07: 0.63 mg via RESPIRATORY_TRACT

## 2017-06-07 NOTE — Assessment & Plan Note (Signed)
I cannot exclude an aspiration event but doubt active infection.  There is tussive sternal soreness. Plan-nebulizer treatment Xopenex, Depo-Medrol, benzonatate Perles.  CXR.  Continue Symbicort.  Also resume the Mucinex/Pepcid/Prilosec regimen that she had used before for cough suppression.  Keep pending appointment.

## 2017-06-07 NOTE — Telephone Encounter (Signed)
Can add on at end of day or put on Cassidy Walton's schedule for 12/24 but nothing else to offer over the phone  Will need to return with all meds/ inhalers

## 2017-06-07 NOTE — Telephone Encounter (Signed)
Spoke with pt. She is leaving town on Sunday for the holiday. We have worked it out, pt has been scheduled to see CY today 11am. Pt was very appreciative. Nothing further was needed.

## 2017-06-07 NOTE — Progress Notes (Signed)
06/07/17- 56 yoF never smoker followed here fo asthma, allergic rhinitis, chronic cough Acute visit- MR patient; Pt states she feels like she can not shake the cough and "sick" feeling-pain in center of chest and sore throat. Pt has taken 2 days of Prednisone.  CT chest HR 08/01/16- minor nodules, NAD She and her husband travel constantly to trade shows[place].  No new exposures but she associates these show areas with dried dust and crowds. Coughing now harder than usual and with tussive substernal pain for at least 2 weeks.  Scant white sputum.  No fever or chills.  Does not feel sick.  Much worse lying down-cough wakes her.  Not aware of reflux but resumed Pepcid.  Started a prednisone taper 2 days ago.  Continue Symbicort 80 and Flonase but has not felt she needed rescue inhaler-does not notice wheeze. Has a triple therapy antireflux/cough regimen with Mucinex, Pepcid, Prilosec which she has not resumed.  ROS-see HPI   + = positive Constitutional:    weight loss, night sweats, fevers, chills, fatigue, lassitude. HEENT:    headaches, difficulty swallowing, tooth/dental problems, sore throat,       sneezing, itching, ear ache, nasal congestion, post nasal drip, snoring CV:    +chest pain, orthopnea, PND, swelling in lower extremities, anasarca,                                  dizziness, palpitations Resp:   shortness of breath with exertion or at rest.                +productive cough,  + non-productive cough, coughing up of blood.              change in color of mucus.  wheezing.   Skin:    rash or lesions. GI:  No-   heartburn, indigestion, abdominal pain, nausea, vomiting, diarrhea,                 change in bowel habits, loss of appetite GU: dysuria, change in color of urine, no urgency or frequency.   flank pain. MS:   joint pain, stiffness, decreased range of motion, back pain. Neuro-     nothing unusual Psych:  change in mood or affect.  depression or anxiety.   memory loss.  OBJ-  Physical Exam General- Alert, Oriented, Affect-appropriate, Distress+mild distress Skin- rash-none, lesions- none, excoriation- none Lymphadenopathy- none Head- atraumatic            Eyes- Gross vision intact, PERRLA, conjunctivae and secretions clear            Ears- Hearing, canals-normal            Nose- Clear, no-Septal dev, mucus, polyps, erosion, perforation             Throat- Mallampati II , mucosa clear , drainage- none, tonsils- atrophic Neck- flexible , trachea midline, no stridor , thyroid nl, carotid no bruit Chest - symmetrical excursion , unlabored           Heart/CV- RRR , no murmur , no gallop  , no rub, nl s1 s2                           - JVD- none , edema- none, stasis changes- none, varices- none           Lung-quiet chest with no rub or dullness.  Deep wheezy cough paroxysmal, otherwise no wheeze           Chest wall-  Abd-  Br/ Gen/ Rectal- Not done, not indicated Extrem- cyanosis- none, clubbing, none, atrophy- none, strength- nl Neuro- grossly intact to observation

## 2017-06-07 NOTE — Telephone Encounter (Signed)
Spoke with pt. States that she is not feeling any better. Reports increased coughing, wheezing and PND. Denies chest tightness, SOB or fever. MR gave her a prednisone taper on 06/05/17. We do not have any available appointments today. Pt would like recommendations.  MW - please advise as MR is not available today. Thanks.

## 2017-06-07 NOTE — Patient Instructions (Signed)
Order- neb xop 0.63   Dx exacerbation asthmatic bronchitis  Depo 80     Finish prednisone taper  Resume  your triple therapy mucinex/ pepcid/ prilosec  Script sent for benzonatate perles for cough  Order CXR    Dx exacerbation asthmatic bronchitis

## 2017-06-12 ENCOUNTER — Telehealth: Payer: Self-pay | Admitting: Internal Medicine

## 2017-06-12 NOTE — Telephone Encounter (Signed)
Please see results note from pt's CXR report. Will sign off.

## 2017-06-14 DIAGNOSIS — D126 Benign neoplasm of colon, unspecified: Secondary | ICD-10-CM | POA: Diagnosis not present

## 2017-06-14 DIAGNOSIS — Z1211 Encounter for screening for malignant neoplasm of colon: Secondary | ICD-10-CM | POA: Diagnosis not present

## 2017-07-01 ENCOUNTER — Other Ambulatory Visit: Payer: Self-pay | Admitting: Internal Medicine

## 2017-10-02 ENCOUNTER — Ambulatory Visit: Payer: BLUE CROSS/BLUE SHIELD | Admitting: Obstetrics and Gynecology

## 2017-10-02 ENCOUNTER — Ambulatory Visit: Payer: BLUE CROSS/BLUE SHIELD | Admitting: Nurse Practitioner

## 2017-10-02 ENCOUNTER — Other Ambulatory Visit (HOSPITAL_COMMUNITY)
Admission: RE | Admit: 2017-10-02 | Discharge: 2017-10-02 | Disposition: A | Discharge status: Home / Self Care | Payer: BLUE CROSS/BLUE SHIELD | Source: Ambulatory Visit | Attending: Obstetrics and Gynecology | Admitting: Obstetrics and Gynecology

## 2017-10-02 ENCOUNTER — Telehealth: Payer: Self-pay | Admitting: Internal Medicine

## 2017-10-02 ENCOUNTER — Other Ambulatory Visit: Payer: Self-pay

## 2017-10-02 ENCOUNTER — Encounter: Payer: Self-pay | Admitting: Obstetrics and Gynecology

## 2017-10-02 VITALS — BP 146/80 | HR 80 | Resp 18 | Ht 62.5 in | Wt 190.0 lb

## 2017-10-02 DIAGNOSIS — Z01419 Encounter for gynecological examination (general) (routine) without abnormal findings: Secondary | ICD-10-CM | POA: Diagnosis not present

## 2017-10-02 DIAGNOSIS — R3915 Urgency of urination: Secondary | ICD-10-CM

## 2017-10-02 LAB — POCT URINALYSIS DIPSTICK
BILIRUBIN UA: NEGATIVE
Blood, UA: NEGATIVE
Glucose, UA: NEGATIVE
Ketones, UA: NEGATIVE
Leukocytes, UA: NEGATIVE
Nitrite, UA: NEGATIVE
Protein, UA: NEGATIVE
Urobilinogen, UA: 0.2 E.U./dL
pH, UA: 5 (ref 5.0–8.0)

## 2017-10-02 MED ORDER — ESTRADIOL 10 MCG VA TABS: ORAL_TABLET | VAGINAL | 3 refills | Status: DC

## 2017-10-02 NOTE — Telephone Encounter (Signed)
Go back on prilosec and pepcid

## 2017-10-02 NOTE — Patient Instructions (Signed)

## 2017-10-02 NOTE — Telephone Encounter (Signed)
Called and spoke with patient she states that in January she stopped taking her Prilosec and Pepcid due to MR saying she could stop it. She still does her Albuterol if needed, her Symbicort daily and her Flonase. Patient states that her cough has come back, it is not as bad as before. [place] and will be outside the whole time. She is wanting to know what she can do for this or if she needs to go back on her Prilosec and Pepcid.   BQ please advise, thank you.

## 2017-10-02 NOTE — Progress Notes (Signed)
57 y.o. G0P0000 Single Caucasian female here for annual exam.    Reporting urinary urgency.  Has urinary incontinence with sneeze or cough.  Using Vagifem.  Can forget for a couple of weeks.  Takes chlor-trimeton nightly for a couple of years. Has sinus drainage.  Recommended by pulmonologist.   Urine - negative.   PCP: Dr. [name]   Rhuematology:  Dr. Mallie Mussel  Patient's last menstrual period was 03/24/2011.           Sexually active: Yes.    The current method of family planning is post menopausal status.    Exercising: Yes.    total gym and walking Smoker:  no  Health Maintenance: Pap:  09/26/16 Neg:Pos HR HPV -- Negative genotype for 16/18/45. History of abnormal Pap:  Yes, hx of CIN III 1991, normal since MMG:  01/01/17 BIRADS 1 negative/density c Colonoscopy:  Summer 2018 -- normal w/ [facility] BMD:  n/a  Result  n/a TDaP:  Patient declines Gardasil:   n/a HIV and Hep C: 09/19/15 Negative Screening Labs:  Rheumatologist    reports that she has never smoked. She has never used smokeless tobacco. She reports that she drinks about 3.0 - 3.6 oz of alcohol per week. She reports that she does not use drugs.  Past Medical History:  Diagnosis Date  . Abnormal pap    CIN III  . Environmental allergies 04/19/15  . Heart murmur   . History of polymyalgia rheumatica   . HSV-1 infection   . Recurrent laryngeal neuropathy 2017  . Spastic colon    hx of Spastic colon    Past Surgical History:  Procedure Laterality Date  . CHOLECYSTECTOMY    . cold knfe cone     CIN III.    Current Outpatient Medications  Medication Sig Dispense Refill  . albuterol (PROAIR HFA) 108 (90 Base) MCG/ACT inhaler Inhale 2 puffs into the lungs every 6 (six) hours as needed for wheezing or shortness of breath. 2 Inhaler 5  . aspirin EC 81 MG tablet Take 81 mg by mouth daily.    Marland Kitchen b complex vitamins tablet Take 1 tablet by mouth daily.    . budesonide-formoterol (SYMBICORT) 80-4.5  MCG/ACT inhaler Inhale 2 puffs into the lungs 2 (two) times daily. 30.6 g 3  . Calcium Carbonate (CALCIUM 600 PO) Take 1 tablet by mouth daily.     . chlorpheniramine (CHLOR-TRIMETON) 4 MG tablet Take 4 mg by mouth at bedtime.    . Ergocalciferol (VITAMIN D2) 2000 UNITS TABS Take 1 tablet by mouth daily.    . Estradiol (VAGIFEM) 10 MCG TABS vaginal tablet Place one tablet vaginally every night before bed for 2 weeks then twice a week 24 tablet 3  . famotidine (PEPCID) 10 MG tablet Take 10 mg by mouth every morning.    . fluticasone (FLONASE) 50 MCG/ACT nasal spray SPRAY 2 SPRAYS INTO EACH NOSTRIL EVERY DAY 16 g 2  . hydroxychloroquine (PLAQUENIL) 200 MG tablet Take 200 mg by mouth 2 (two) times daily.    . meloxicam (MOBIC) 7.5 MG tablet Take 7.5 mg by mouth daily as needed for pain.    . Multiple Vitamin (MULTIVITAMIN) capsule Take 1 capsule by mouth daily.    . niacin 500 MG tablet Take 500 mg by mouth at bedtime.    Marland Kitchen omeprazole (PRILOSEC) 20 MG capsule Take 20 mg by mouth daily.    Marland Kitchen Respiratory Therapy Supplies (FLUTTER) DEVI Use as directed 1 each 0  .  traMADol (ULTRAM) 50 MG tablet 1-2 every 4 hours as needed for cough or pain 40 tablet 0  . valACYclovir (VALTREX) 1000 MG tablet Take 2 g by mouth as needed.    . benzonatate (TESSALON) 200 MG capsule Take 1 capsule (200 mg total) by mouth 3 (three) times daily as needed for cough. (Patient not taking: Reported on 10/02/2017) 30 capsule 1   No current facility-administered medications for this visit.     Family History  Problem Relation Age of Onset  . Heart failure Father   . Osteoarthritis Mother   . Hypertension Mother   . Obesity Sister   . Osteoarthritis Sister   . Osteoarthritis Brother   . Stroke Maternal Grandmother   . Heart disease Maternal Grandmother   . Hypertension Maternal Grandmother   . Stroke Paternal Grandmother   . Heart disease Paternal Grandmother   . Allergies Unknown        "everyone"    ROS:   Pertinent items are noted in HPI.  Otherwise, a comprehensive ROS was negative.  Exam:   BP (!) 146/80 (BP Location: Right Arm, Patient Position: Sitting, Cuff Size: Large)   Pulse 80   Resp 18   Ht 5' 2.5" (1.588 m)   Wt 190 lb (86.2 kg)   LMP 03/24/2011   BMI 34.20 kg/m     General appearance: alert, cooperative and appears stated age Head: Normocephalic, without obvious abnormality, atraumatic Neck: no adenopathy, supple, symmetrical, trachea midline and thyroid normal to inspection and palpation Lungs: clear to auscultation bilaterally Breasts: normal appearance, no masses or tenderness, No nipple retraction or dimpling, No nipple discharge or bleeding, No axillary or supraclavicular adenopathy Heart: regular rate and rhythm Abdomen: soft, non-tender; no masses, no organomegaly Extremities: extremities normal, atraumatic, no cyanosis or edema Skin: Skin color, texture, turgor normal. No rashes or lesions Lymph nodes: Cervical, supraclavicular, and axillary nodes normal. No abnormal inguinal nodes palpated Neurologic: Grossly normal  Pelvic: External genitalia:  no lesions              Urethra:  normal appearing urethra with no masses, tenderness or lesions              Bartholins and Skenes: normal                 Vagina: normal appearing vagina with normal color and discharge, no lesions              Cervix: no lesions              Pap taken: Yes.   Bimanual Exam:  Uterus:  normal size, contour, position, consistency, mobility, non-tender              Adnexa: no mass, fullness, tenderness              Rectal exam: Yes.  .  Confirms.              Anus:  normal sphincter tone, no lesions  Chaperone was present for exam.  Assessment:   Well woman visit with normal exam. Hx CIN III in 1991. Hx normal pap and positive HR HPV with negative 16/18/45 in 2018. Hx polymyalgia rheumatica. On imunosupressive medication.  Hx HTN, coronary atherosclerosis, hyperlipidemia.  Vaginal  atrophy.  On Vagifem.  GSI.   Plan: Mammogram screening discussed. Recommended self breast awareness. Pap and HR HPV yearly. Guidelines for Calcium, Vitamin D, regular exercise program including cardiovascular and weight bearing exercise. Refill of Vagifem for  one year.  Discussed effect on breast cancer.  We discussed Impressa.  We discussed a bone density study through her rheumatologist.  Follow up annually and prn.   After visit summary provided.

## 2017-10-02 NOTE — Telephone Encounter (Signed)
Called pt and advised message from the provider. Pt understood and verbalized understanding. Nothing further is needed.    

## 2017-10-07 LAB — CYTOLOGY - PAP
Diagnosis: NEGATIVE
HPV: NOT DETECTED

## 2017-11-01 ENCOUNTER — Other Ambulatory Visit: Payer: Self-pay | Admitting: Internal Medicine

## 2017-11-06 ENCOUNTER — Ambulatory Visit: Payer: BLUE CROSS/BLUE SHIELD | Admitting: Internal Medicine

## 2017-11-06 ENCOUNTER — Encounter: Payer: Self-pay | Admitting: Internal Medicine

## 2017-11-06 VITALS — BP 130/80 | HR 71 | Ht 62.5 in | Wt 190.2 lb

## 2017-11-06 DIAGNOSIS — J387 Other diseases of larynx: Secondary | ICD-10-CM

## 2017-11-06 DIAGNOSIS — J45991 Cough variant asthma: Secondary | ICD-10-CM | POA: Diagnosis not present

## 2017-11-06 DIAGNOSIS — R053 Chronic cough: Secondary | ICD-10-CM

## 2017-11-06 DIAGNOSIS — R05 Cough: Secondary | ICD-10-CM

## 2017-11-06 LAB — NITRIC OXIDE: Nitric Oxide: 18

## 2017-11-06 NOTE — Addendum Note (Signed)
Addended by: Wyvonne Lenz on: 11/06/2017 02:44 PM   Modules accepted: Orders

## 2017-11-06 NOTE — Patient Instructions (Addendum)
ICD-10-CM   1. Chronic cough R05   2. 

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
## 2017-11-22 ENCOUNTER — Other Ambulatory Visit: Payer: Self-pay | Admitting: Internal Medicine

## 2017-12-05 DIAGNOSIS — Z8349 Family history of other endocrine, nutritional and metabolic diseases: Secondary | ICD-10-CM | POA: Diagnosis not present

## 2017-12-05 DIAGNOSIS — M542 Cervicalgia: Secondary | ICD-10-CM | POA: Diagnosis not present

## 2017-12-05 DIAGNOSIS — M1990 Unspecified osteoarthritis, unspecified site: Secondary | ICD-10-CM | POA: Diagnosis not present

## 2017-12-05 DIAGNOSIS — R21 Rash and other nonspecific skin eruption: Secondary | ICD-10-CM | POA: Diagnosis not present

## 2017-12-05 DIAGNOSIS — E559 Vitamin D deficiency, unspecified: Secondary | ICD-10-CM | POA: Diagnosis not present

## 2017-12-21 ENCOUNTER — Other Ambulatory Visit: Payer: Self-pay | Admitting: Internal Medicine

## 2018-01-09 DIAGNOSIS — M353 Polymyalgia rheumatica: Secondary | ICD-10-CM | POA: Diagnosis not present

## 2018-01-30 ENCOUNTER — Other Ambulatory Visit: Payer: Self-pay | Admitting: Internal Medicine

## 2018-02-21 ENCOUNTER — Other Ambulatory Visit: Payer: Self-pay | Admitting: Obstetrics and Gynecology

## 2018-02-21 NOTE — Telephone Encounter (Signed)
Medication refill request: Yuvafem Last AEX:  10/02/2017 Next AEX: 11/05/2018 Last MMG (if hormonal medication request): 01/01/2017 BI-RADS CATEGORY  1: Negative. Refill authorized: #[id number], 6 refills

## 2018-02-21 NOTE — Telephone Encounter (Signed)
Spoke with patient.  She is out of town but will schedule her mammogram asap.  Informed her we would send in more refills once mammogram is done.

## 2018-02-21 NOTE — Telephone Encounter (Signed)
Please contact patient to have her update her mammogram. I will refill one month of her Vagifem for now.  I can give additional refills after her mammogram is back and is normal.

## 2018-02-27 ENCOUNTER — Other Ambulatory Visit: Payer: Self-pay | Admitting: Internal Medicine

## 2018-03-18 ENCOUNTER — Other Ambulatory Visit: Payer: Self-pay | Admitting: Obstetrics and Gynecology

## 2018-03-18 NOTE — Telephone Encounter (Signed)
Please have her update her mammogram and I can then fill her prescription if her mammogram is normal.  If she has already done her mammogram, then please have her inform us where it was done, so we can get a copy.

## 2018-03-18 NOTE — Telephone Encounter (Signed)
Medication refill request: Yuvafem Last AEX:  10/02/2017 Next AEX: 10/2018 Last MMG (if hormonal medication request): 12/2016 BI-RADS CATEGORY  1: Negative. Refill authorized: #[id number], 2 refills

## 2018-03-18 NOTE — Telephone Encounter (Signed)
Mychart message sent to patient regarding need for mammogram in order to refill yuvafem.

## 2018-04-03 ENCOUNTER — Other Ambulatory Visit: Payer: Self-pay | Admitting: Obstetrics and Gynecology

## 2018-04-03 DIAGNOSIS — Z1231 Encounter for screening mammogram for malignant neoplasm of breast: Secondary | ICD-10-CM

## 2018-04-04 ENCOUNTER — Ambulatory Visit
Admission: RE | Admit: 2018-04-04 | Discharge: 2018-04-04 | Disposition: A | Discharge status: Home / Self Care | Payer: BLUE CROSS/BLUE SHIELD | Source: Ambulatory Visit | Attending: Obstetrics and Gynecology | Admitting: Obstetrics and Gynecology

## 2018-04-04 DIAGNOSIS — Z1231 Encounter for screening mammogram for malignant neoplasm of breast: Secondary | ICD-10-CM | POA: Diagnosis not present

## 2018-05-23 ENCOUNTER — Other Ambulatory Visit: Payer: Self-pay | Admitting: *Deleted

## 2018-05-23 MED ORDER — ALBUTEROL SULFATE HFA 108 (90 BASE) MCG/ACT IN AERS: INHALATION_SPRAY | RESPIRATORY_TRACT | 2 refills | Status: DC

## 2018-06-03 DIAGNOSIS — M1990 Unspecified osteoarthritis, unspecified site: Secondary | ICD-10-CM | POA: Diagnosis not present

## 2018-06-03 DIAGNOSIS — M542 Cervicalgia: Secondary | ICD-10-CM | POA: Diagnosis not present

## 2018-06-03 DIAGNOSIS — R21 Rash and other nonspecific skin eruption: Secondary | ICD-10-CM | POA: Diagnosis not present

## 2018-06-03 DIAGNOSIS — E559 Vitamin D deficiency, unspecified: Secondary | ICD-10-CM | POA: Diagnosis not present

## 2018-06-04 DIAGNOSIS — J4520 Mild intermittent asthma, uncomplicated: Secondary | ICD-10-CM | POA: Diagnosis not present

## 2018-06-04 DIAGNOSIS — E559 Vitamin D deficiency, unspecified: Secondary | ICD-10-CM | POA: Diagnosis not present

## 2018-06-04 DIAGNOSIS — M353 Polymyalgia rheumatica: Secondary | ICD-10-CM | POA: Diagnosis not present

## 2018-06-04 DIAGNOSIS — E782 Mixed hyperlipidemia: Secondary | ICD-10-CM | POA: Diagnosis not present

## 2018-07-02 DIAGNOSIS — H00013 Hordeolum externum right eye, unspecified eyelid: Secondary | ICD-10-CM | POA: Diagnosis not present

## 2018-07-02 DIAGNOSIS — L219 Seborrheic dermatitis, unspecified: Secondary | ICD-10-CM | POA: Diagnosis not present

## 2018-07-31 DIAGNOSIS — I289 Disease of pulmonary vessels, unspecified: Secondary | ICD-10-CM | POA: Diagnosis not present

## 2018-08-06 ENCOUNTER — Encounter: Payer: Self-pay | Admitting: Internal Medicine

## 2018-08-06 ENCOUNTER — Ambulatory Visit: Payer: BLUE CROSS/BLUE SHIELD | Admitting: Internal Medicine

## 2018-08-06 VITALS — BP 118/70 | HR 76 | Ht 62.5 in | Wt 185.2 lb

## 2018-08-06 DIAGNOSIS — R05 Cough: Secondary | ICD-10-CM | POA: Diagnosis not present

## 2018-08-06 DIAGNOSIS — J387 Other diseases of larynx: Secondary | ICD-10-CM

## 2018-08-06 DIAGNOSIS — R053 Chronic cough: Secondary | ICD-10-CM

## 2018-08-06 DIAGNOSIS — IMO0001 Reserved for inherently not codable concepts without codable children: Secondary | ICD-10-CM

## 2018-08-06 DIAGNOSIS — R059 Cough, unspecified: Secondary | ICD-10-CM

## 2018-08-06 DIAGNOSIS — R911 Solitary pulmonary nodule: Secondary | ICD-10-CM | POA: Diagnosis not present

## 2018-08-06 LAB — NITRIC OXIDE: NITRIC OXIDE: 16
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
## 2018-08-06 NOTE — Patient Instructions (Signed)
ICD-10-CM   1. Chronic cough R05 Nitric oxide  2. Lung nodule < 6cm on CT R91.1   3. Irritable larynx syndrome J38.7     I agree I think a lot of her cough is because of irritable larynx syndrome otherwise, cough neuropathy  Plan -Too bad you cannot do gabapentin because of previous history of grogginess -CMA  will add this to the allergy list -Refer Dr. Providence Crosby [facility] ENT to address cough neuropathy/irritable larynx -For now continue Symbicort, nasal steroid and other medications -Repeat high-resolution CT chest to look for pulmonary causes of chronic cough and particularly because you have have previous history of lung nodule -Do blood allergy test sometime next week after 1 week washout from systemic prednisone  Follow-up -We will call you with the blood test results and CT scan results -Otherwise return in 3 months for follow-up; RSI cough score at follow-up

## 2018-08-06 NOTE — Addendum Note (Signed)
Addended by: Wyvonne Lenz on: 08/06/2018 09:39 AM   Modules accepted: Orders

## 2018-08-11 ENCOUNTER — Other Ambulatory Visit: Payer: BLUE CROSS/BLUE SHIELD

## 2018-08-11 DIAGNOSIS — J384 Edema of larynx: Secondary | ICD-10-CM | POA: Diagnosis not present

## 2018-08-11 DIAGNOSIS — Z79899 Other long term (current) drug therapy: Secondary | ICD-10-CM | POA: Diagnosis not present

## 2018-08-11 DIAGNOSIS — J383 Other diseases of vocal cords: Secondary | ICD-10-CM | POA: Diagnosis not present

## 2018-08-11 DIAGNOSIS — K219 Gastro-esophageal reflux disease without esophagitis: Secondary | ICD-10-CM | POA: Diagnosis not present

## 2018-08-11 DIAGNOSIS — R05 Cough: Secondary | ICD-10-CM | POA: Diagnosis not present

## 2018-08-11 DIAGNOSIS — R490 Dysphonia: Secondary | ICD-10-CM | POA: Diagnosis not present

## 2018-08-11 DIAGNOSIS — J387 Other diseases of larynx: Secondary | ICD-10-CM | POA: Diagnosis not present

## 2018-08-11 DIAGNOSIS — Z87891 Personal history of nicotine dependence: Secondary | ICD-10-CM | POA: Diagnosis not present

## 2018-08-11 DIAGNOSIS — R053 Chronic cough: Secondary | ICD-10-CM

## 2018-08-12 LAB — RESPIRATORY ALLERGY PROFILE REGION II ~~LOC~~
ALLERGEN, A. ALTERNATA, M6: 0.13 kU/L — AB
ALLERGEN, D PTERNOYSSINUS, D1: 0.14 kU/L — AB
ALLERGEN, P. NOTATUM, M1: 0.45 kU/L — AB
Allergen, Cottonwood, t14: <0.1 kU/L
Allergen, Mulberry, t76: <0.1 kU/L
Allergen, Oak,t7: <0.1 kU/L
Aspergillus fumigatus, m3: 0.91 kU/L — ABNORMAL HIGH
Bermuda Grass: <0.1 kU/L
Box Elder IgE: <0.1 kU/L
CLASS: 0
CLASS: 0
CLASS: 0
CLASS: 0
CLASS: 0
CLASS: 0
CLASS: 0
CLASS: 0
CLASS: 0
CLASS: 0
CLASS: 0
CLASS: 0
CLASS: 1
CLASS: 2
COMMON RAGWEED (SHORT) (W1) IGE: <0.1 kU/L
Class: 0
Class: 0
Class: 0
Class: 0
Class: 0
Class: 0
Class: 0
Class: 0
Class: 0
Class: 0
Cockroach: <0.1 kU/L
D. farinae: 0.24 kU/L — ABNORMAL HIGH
Dog Dander: <0.1 kU/L
Elm IgE: <0.1 kU/L
IgE (Immunoglobulin E), Serum: 49 kU/L (ref <=114)
Johnson Grass: <0.1 kU/L
Pecan/Hickory Tree IgE: <0.1 kU/L
Rough Pigweed  IgE: <0.1 kU/L
Sheep Sorrel IgE: <0.1 kU/L

## 2018-08-12 LAB — INTERPRETATION:

## 2018-08-13 DIAGNOSIS — L219 Seborrheic dermatitis, unspecified: Secondary | ICD-10-CM | POA: Diagnosis not present

## 2018-08-13 DIAGNOSIS — B351 Tinea unguium: Secondary | ICD-10-CM | POA: Diagnosis not present

## 2018-08-13 DIAGNOSIS — D225 Melanocytic nevi of trunk: Secondary | ICD-10-CM | POA: Diagnosis not present

## 2018-08-19 ENCOUNTER — Ambulatory Visit: Payer: BLUE CROSS/BLUE SHIELD | Admitting: Internal Medicine

## 2018-08-20 ENCOUNTER — Telehealth: Payer: Self-pay | Admitting: Internal Medicine

## 2018-08-20 ENCOUNTER — Ambulatory Visit (INDEPENDENT_AMBULATORY_CARE_PROVIDER_SITE_OTHER)
Admission: RE | Admit: 2018-08-20 | Discharge: 2018-08-20 | Disposition: A | Discharge status: Home / Self Care | Payer: BLUE CROSS/BLUE SHIELD | Source: Ambulatory Visit | Attending: Internal Medicine | Admitting: Internal Medicine

## 2018-08-20 DIAGNOSIS — R05 Cough: Secondary | ICD-10-CM | POA: Diagnosis not present

## 2018-08-20 DIAGNOSIS — R911 Solitary pulmonary nodule: Secondary | ICD-10-CM

## 2018-08-20 DIAGNOSIS — R059 Cough, unspecified: Secondary | ICD-10-CM

## 2018-08-20 NOTE — Telephone Encounter (Signed)
Called and spoke with Patient. Patient stated that she could see respiratory allergy testing on my chart, but doesn't understand readings.  Patient is requesting results from Dr Marchelle Gearing.   Allergy profile results are available, visible on my chart, but not resulted.  Patient stated that she has appt in [facility] 08/21/18 at 0800, may leave message on VM. Dr Marchelle Gearing, please advise on results

## 2018-08-21 DIAGNOSIS — K219 Gastro-esophageal reflux disease without esophagitis: Secondary | ICD-10-CM | POA: Diagnosis not present

## 2018-08-21 DIAGNOSIS — R05 Cough: Secondary | ICD-10-CM | POA: Diagnosis not present

## 2018-08-21 DIAGNOSIS — R490 Dysphonia: Secondary | ICD-10-CM | POA: Diagnosis not present

## 2018-08-21 DIAGNOSIS — J385 Laryngeal spasm: Secondary | ICD-10-CM | POA: Diagnosis not present

## 2018-08-22 NOTE — Telephone Encounter (Signed)
Blood allergy - released into my chart: basically negative except for mild mold and house dust mite allergy  CT - no cancer, no pneumonia, no fibrosis, no emphysema. HAs some nodules -. Need followup CT chest without contrast in 1 year     SIGNATURE    Dr. Kalman Shan, M.D., F.C.C.P,  Pulmonary and Critical Care Medicine Staff Physician, [facility] Center Director - Interstitial Lung Disease  Program  Pulmonary Fibrosis [facility] at [facility] [place], [place], [address]  Pager: [phone], If no answer or between  15:00h - 7:00h: call [phone] Telephone: [phone]  4:47 PM 08/22/2018

## 2018-08-22 NOTE — Telephone Encounter (Signed)
Called and spoke with pt letting her know the results of labwork as well as CT scan and stated to pt that we would repeat CT in 1 year to make sure things are still stable with the nodules. Pt expressed understanding. Order has been placed for ct to be completed in 1 year. Nothing further needed.

## 2018-10-07 DIAGNOSIS — R490 Dysphonia: Secondary | ICD-10-CM | POA: Diagnosis not present

## 2018-10-07 DIAGNOSIS — R05 Cough: Secondary | ICD-10-CM | POA: Diagnosis not present

## 2018-10-07 DIAGNOSIS — J385 Laryngeal spasm: Secondary | ICD-10-CM | POA: Diagnosis not present

## 2018-10-07 DIAGNOSIS — K219 Gastro-esophageal reflux disease without esophagitis: Secondary | ICD-10-CM | POA: Diagnosis not present

## 2018-10-14 DIAGNOSIS — J385 Laryngeal spasm: Secondary | ICD-10-CM | POA: Diagnosis not present

## 2018-10-14 DIAGNOSIS — K219 Gastro-esophageal reflux disease without esophagitis: Secondary | ICD-10-CM | POA: Diagnosis not present

## 2018-10-14 DIAGNOSIS — R05 Cough: Secondary | ICD-10-CM | POA: Diagnosis not present

## 2018-10-14 DIAGNOSIS — R490 Dysphonia: Secondary | ICD-10-CM | POA: Diagnosis not present

## 2018-10-29 ENCOUNTER — Ambulatory Visit: Payer: BLUE CROSS/BLUE SHIELD | Admitting: Internal Medicine

## 2018-11-05 ENCOUNTER — Ambulatory Visit: Payer: BLUE CROSS/BLUE SHIELD | Admitting: Obstetrics and Gynecology

## 2018-11-06 DIAGNOSIS — R490 Dysphonia: Secondary | ICD-10-CM | POA: Diagnosis not present

## 2018-11-06 DIAGNOSIS — K219 Gastro-esophageal reflux disease without esophagitis: Secondary | ICD-10-CM | POA: Diagnosis not present

## 2018-11-06 DIAGNOSIS — J385 Laryngeal spasm: Secondary | ICD-10-CM | POA: Diagnosis not present

## 2018-11-06 DIAGNOSIS — R05 Cough: Secondary | ICD-10-CM | POA: Diagnosis not present

## 2018-11-12 DIAGNOSIS — R490 Dysphonia: Secondary | ICD-10-CM | POA: Diagnosis not present

## 2018-11-12 DIAGNOSIS — R05 Cough: Secondary | ICD-10-CM | POA: Diagnosis not present

## 2018-11-26 ENCOUNTER — Encounter: Payer: Self-pay | Admitting: Obstetrics and Gynecology

## 2018-11-26 ENCOUNTER — Ambulatory Visit: Payer: BC Managed Care – PPO | Admitting: Obstetrics and Gynecology

## 2018-11-26 ENCOUNTER — Other Ambulatory Visit (HOSPITAL_COMMUNITY)
Admission: RE | Admit: 2018-11-26 | Discharge: 2018-11-26 | Disposition: A | Discharge status: Home / Self Care | Payer: BC Managed Care – PPO | Source: Ambulatory Visit | Attending: Obstetrics and Gynecology | Admitting: Obstetrics and Gynecology

## 2018-11-26 ENCOUNTER — Other Ambulatory Visit: Payer: Self-pay

## 2018-11-26 VITALS — BP 136/80 | HR 76 | Temp 97.8°F | Resp 14 | Ht 62.25 in | Wt 186.0 lb

## 2018-11-26 DIAGNOSIS — Z01419 Encounter for gynecological examination (general) (routine) without abnormal findings: Secondary | ICD-10-CM

## 2018-11-26 DIAGNOSIS — Z23 Encounter for immunization: Secondary | ICD-10-CM

## 2018-11-26 MED ORDER — ESTRADIOL 10 MCG VA TABS: ORAL_TABLET | VAGINAL | 3 refills | Status: DC

## 2018-11-26 NOTE — Progress Notes (Signed)
58 y.o. G0P0000 Single Caucasian female here for annual exam.    Having laryngeal neuropathy.  Seeing an ENT for chronic cough and seeing a voice therapist.   Does leak with cough and with sex.   Not using her Vagifem.  Forgetting during the pandemic.   She has a skin lesion on her left leg and this is not resolving.  She has a dermatologist who has already seen this.   Lipids controlled with diet and niacin.   Receiving unemployment.  Is a weaver.  Has a vegetable garden.  Working on a web site.  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   PCP: Patient will have a new PCP with the office of Darcus Austin, MD     Patient's last menstrual period was 03/24/2011.           Sexually active: No.  The current method of family planning is post menopausal status.    Exercising: Yes.    weight lifting Smoker:  no  Health Maintenance: Pap:  10/02/17 Neg:Neg HR HPV; 09/26/16 Neg:Pos HR HPV -- Negative genotype for 16/18/45 History of abnormal Pap:  Yes, hx of CIN III 1991, normal since MMG:  04/04/18 BIRADS 1 negative/density c Colonoscopy:  Summer 2018  BMD:   n/a  Result  n/a TDaP: need TDaP Gardasil:   n/a HIV and Hep C: 09/19/15 Negative Screening Labs: PCP   reports that she has never smoked. She has never used smokeless tobacco. She reports current alcohol use of about 5.0 - 6.0 standard drinks of alcohol per week. She reports that she does not use drugs.  Past Medical History:  Diagnosis Date  . Abnormal pap    CIN III  . Environmental allergies 04/19/15  . Heart murmur   . History of polymyalgia rheumatica   . HSV-1 infection   . Recurrent laryngeal neuropathy 2017  . Spastic colon    hx of Spastic colon    Past Surgical History:  Procedure Laterality Date  . CHOLECYSTECTOMY    . cold knfe cone     CIN III.    Current Outpatient Medications  Medication Sig Dispense Refill  . albuterol (PROAIR HFA) 108 (90 Base) MCG/ACT inhaler INHALE 2 PUFFS INTO THE LUNGS EVERY 6 (SIX) HOURS AS  NEEDED FOR WHEEZING OR SHORTNESS OF BREATH. 17 Inhaler 2  . b complex vitamins tablet Take 1 tablet by mouth every other day.     . chlorpheniramine (WAL-FINATE) 4 MG tablet Take 4 mg by mouth at bedtime.    . Ergocalciferol (VITAMIN D2) 2000 UNITS TABS Take 1 tablet by mouth daily.    . famotidine (PEPCID) 20 MG tablet Take 20 mg by mouth at bedtime.    . fluticasone (FLONASE) 50 MCG/ACT nasal spray INSTILL 2 SPRAYS INTO EACH NOSTRIL EVERY DAY (Patient taking differently: Place into both nostrils daily. 1-2 sprays) 16 g 6  . guaiFENesin (MUCINEX) 600 MG 12 hr tablet Take 600 mg by mouth 2 (two) times daily.    . hydroxychloroquine (PLAQUENIL) 200 MG tablet Take 200 mg by mouth 2 (two) times daily.    . meloxicam (MOBIC) 7.5 MG tablet Take 7.5 mg by mouth daily as needed for pain.    . Misc Natural Products (TURMERIC CURCUMIN) CAPS Take 1 capsule by mouth daily.    . Multiple Vitamin (MULTIVITAMIN) capsule Take 1 capsule by mouth daily.    . niacin 500 MG tablet Take 500 mg by mouth at bedtime.    Marland Kitchen omeprazole (PRILOSEC) 20 MG  capsule Take 20 mg by mouth daily.    Marland Kitchen. Respiratory Therapy Supplies (FLUTTER) DEVI Use as directed 1 each 0  . SYMBICORT 80-4.5 MCG/ACT inhaler INHALE 2 PUFFS TWICE A DAY 30.6 Inhaler 11  . traMADol (ULTRAM) 50 MG tablet 1-2 every 4 hours as needed for cough or pain 40 tablet 0  . valACYclovir (VALTREX) 1000 MG tablet Take 2 g by mouth as needed.    Anson Fret. YUVAFEM 10 MCG TABS vaginal tablet PLACE ONE TABLET VAGINALLY EVERY NIGHT BEFORE BED FOR 2 WEEKS THEN TWICE A WEEK 8 tablet 0   No current facility-administered medications for this visit.     Family History  Problem Relation Age of Onset  . Heart failure Father   . Osteoarthritis Mother   . Hypertension Mother   . Obesity Sister   . Osteoarthritis Sister   . Osteoarthritis Brother   . Stroke Maternal Grandmother   . Heart disease Maternal Grandmother   . Hypertension Maternal Grandmother   . Stroke Paternal  Grandmother   . Heart disease Paternal Grandmother   . Allergies Other        "everyone"  . Breast cancer Neg Hx     Review of Systems  Constitutional: Negative.   HENT: Negative.   Eyes: Negative.   Respiratory: Negative.   Cardiovascular: Negative.   Gastrointestinal: Negative.   Endocrine: Negative.   Genitourinary: Negative.   Musculoskeletal: Negative.   Skin: Negative.   Allergic/Immunologic: Negative.   Neurological: Negative.   Hematological: Negative.   Psychiatric/Behavioral: Negative.     Exam:   BP 136/80 (BP Location: Right Arm, Patient Position: Sitting, Cuff Size: Large)   Pulse 76   Temp 97.8 F (36.6 C) (Temporal)   Resp 14   Ht 5' 2.25" (1.581 m)   Wt 186 lb (84.4 kg)   LMP 03/24/2011   BMI 33.75 kg/m     General appearance: alert, cooperative and appears stated age Head: normocephalic, without obvious abnormality, atraumatic Neck: no adenopathy, supple, symmetrical, trachea midline and thyroid normal to inspection and palpation Lungs: clear to auscultation bilaterally Breasts: normal appearance, no masses or tenderness, No nipple retraction or dimpling, No nipple discharge or bleeding, No axillary adenopathy Heart: regular rate and rhythm Abdomen: soft, non-tender; no masses, no organomegaly Extremities: extremities normal, atraumatic, no cyanosis or edema Skin: skin color, texture, turgor normal. No rashes or lesions Lymph nodes: cervical, supraclavicular, and axillary nodes normal. Neurologic: grossly normal  Pelvic: External genitalia:  no lesions              No abnormal inguinal nodes palpated.              Urethra:  normal appearing urethra with no masses, tenderness or lesions              Bartholins and Skenes: normal                 Vagina: normal appearing vagina with normal color and discharge, no lesions              Cervix: no lesions              Pap taken: Yes.   Bimanual Exam:  Uterus:  normal size, contour, position,  consistency, mobility, non-tender              Adnexa: no mass, fullness, tenderness              Rectal exam: Yes.  .  Confirms.  Anus:  normal sphincter tone, no lesions  Chaperone was present for exam.  Assessment:   Well woman visit with normal exam. Hx CIN III in 1991. Hx normal pap and positive HR HPV with negative 16/18/45 in 2018. Hx polymyalgia rheumatica. On imunosupressive medication.  Hx HTN, coronary atherosclerosis, hyperlipidemia.  Vaginal atrophy.   GSI.  Hx HSV 1.   Plan: Mammogram screening discussed. Self breast awareness reviewed. Pap and HR HPV as above. Guidelines for Calcium, Vitamin D, regular exercise program including cardiovascular and weight bearing exercise. Refill of Vagifem for one year.  I discussed the potential effect on breast cancer.  We discussed Impressa, weight loss, control of cough, referral to PT, and surgery. She may call back to ask for referral for pelvic floor therapy.  Labs with Rheumatology and PCP.  TDap.  Follow up annually and prn.   After visit summary provided.

## 2018-11-26 NOTE — Patient Instructions (Signed)

## 2018-11-27 ENCOUNTER — Telehealth: Payer: Self-pay | Admitting: *Deleted

## 2018-11-27 NOTE — Telephone Encounter (Signed)
Left message to call Sharee Pimple, RN at [facility].   Fax request received from [facility], estradiol 10 mcg vag "rejected". Per review of patients formulary, covered if filled as Brand Vagifem.   Call to [facility], asked if Rx covered if filled as Vagifem? Confirmed Rx covered under plan if filled as brand Vagifem. Pharmacy will fill Rx and notify patient when RX ready.

## 2018-11-28 LAB — CYTOLOGY - PAP
Diagnosis: NEGATIVE
HPV: NOT DETECTED

## 2018-12-03 DIAGNOSIS — M1990 Unspecified osteoarthritis, unspecified site: Secondary | ICD-10-CM | POA: Diagnosis not present

## 2018-12-03 DIAGNOSIS — M542 Cervicalgia: Secondary | ICD-10-CM | POA: Diagnosis not present

## 2018-12-03 DIAGNOSIS — R21 Rash and other nonspecific skin eruption: Secondary | ICD-10-CM | POA: Diagnosis not present

## 2018-12-03 DIAGNOSIS — E559 Vitamin D deficiency, unspecified: Secondary | ICD-10-CM | POA: Diagnosis not present

## 2018-12-04 NOTE — Telephone Encounter (Signed)
Spoke with patient, advised as seen below. Patient has picked up RX and is aware, thankful for call.   Encounter closed.

## 2018-12-18 DIAGNOSIS — K219 Gastro-esophageal reflux disease without esophagitis: Secondary | ICD-10-CM | POA: Diagnosis not present

## 2018-12-18 DIAGNOSIS — J385 Laryngeal spasm: Secondary | ICD-10-CM | POA: Diagnosis not present

## 2018-12-18 DIAGNOSIS — R490 Dysphonia: Secondary | ICD-10-CM | POA: Diagnosis not present

## 2018-12-18 DIAGNOSIS — R05 Cough: Secondary | ICD-10-CM | POA: Diagnosis not present

## 2019-02-19 ENCOUNTER — Telehealth: Payer: Self-pay | Admitting: Internal Medicine

## 2019-02-19 MED ORDER — FLUTICASONE PROPIONATE 50 MCG/ACT NA SUSP
NASAL | 6 refills | Status: DC
Start: 2019-02-19 — End: 2019-12-01

## 2019-02-19 NOTE — Telephone Encounter (Signed)
Call returned to patient, confirmed medication and pharmacy. Refill sent. Nothing further needed at this time.  

## 2019-03-04 ENCOUNTER — Ambulatory Visit (INDEPENDENT_AMBULATORY_CARE_PROVIDER_SITE_OTHER): Payer: BC Managed Care – PPO | Admitting: Internal Medicine

## 2019-03-04 ENCOUNTER — Encounter: Payer: Self-pay | Admitting: Internal Medicine

## 2019-03-04 ENCOUNTER — Other Ambulatory Visit: Payer: Self-pay

## 2019-03-04 VITALS — BP 130/82 | HR 72 | Temp 97.2°F | Ht 62.5 in | Wt 190.0 lb

## 2019-03-04 DIAGNOSIS — R053 Chronic cough: Secondary | ICD-10-CM

## 2019-03-04 DIAGNOSIS — R05 Cough: Secondary | ICD-10-CM

## 2019-03-04 DIAGNOSIS — J45991 Cough variant asthma: Secondary | ICD-10-CM

## 2019-03-04 DIAGNOSIS — R059 Cough, unspecified: Secondary | ICD-10-CM

## 2019-03-04 DIAGNOSIS — R918 Other nonspecific abnormal finding of lung field: Secondary | ICD-10-CM

## 2019-03-04 DIAGNOSIS — J387 Other diseases of larynx: Secondary | ICD-10-CM | POA: Diagnosis not present

## 2019-03-04 NOTE — Progress Notes (Signed)
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 abnormality, atraumatic  Eyes:    PERRL, conjunctiva/corneas clear,  Ears:    Normal TM's and external ear canals, both ears  Nose:   Nares normal, septum midline, mucosa normal, no drainage    or sinus tenderness. OXYGEN ON  - no . Patient is @ ra   Throat:   Lips, mucosa, and tongue normal; teeth and gums normal. Cyanosis on lips - no  Neck:   Supple, symmetrical, trachea midline, no adenopathy;    thyroid:  no enlargement/tenderness/nodules; no carotid   bruit or JVD  Back:     Symmetric, no curvature, ROM normal, no CVA tenderness  Lungs:     Distress - no , Wheeze no, Barrell Chest - no, Purse lip breathing - no, Crackles - no   Chest Wall:    No tenderness or deformity.    Heart:    Regular rate and rhythm, S1 and S2 normal, no rub   or gallop, Murmur - no  Breast Exam:    NOT DONE  Abdomen:     Soft, non-tender, bowel sounds active all four quadrants,    no masses, no organomegaly. Visceral obesity - yes  Genitalia:   NOT DONE  Rectal:   NOT DONE  Extremities:   Extremities - normal, Has Cane - no, Clubbing - no, Edema - no  Pulses:   2+ and symmetric all extremities  Skin:   Stigmata  of Connective Tissue Disease - no  Lymph nodes:  Cervical, supraclavicular, and axillary nodes normal  Psychiatric:  Neurologic:   Pleasant - yes, Anxious - no, Flat affect - no  CAm-ICU - neg, Alert and Oriented x 3 - yes, Moves all 4s - yes, Speech - normal, Cognition - intact           Assessment:       ICD-10-CM   1. Chronic cough  R05   2. Cough variant asthma  J45.991   3. Irritable larynx syndrome  J38.7   4. Multiple lung nodules on CT  R91.8        Plan:     Patient Instructions     ICD-10-CM   1. Chronic cough  R05   2. Cough variant asthma  J45.991   3. Irritable larynx syndrome  J38.7   4. Multiple lung nodules on CT  R91.8    #cough  - glad you are better after seeing Cassidy Delford Field at [facility]  - continue symbicort -ensure refills  - do voice rehab per Cassidy Delford Field  - talk less - mask - use KN95  - continue low risk covid activities - flu shot when able  #lung nodules - march 2020  - do followup CT chest in July-Sept 2021  [place]  -July-sept 2021 after CT     SIGNATURE    Cassidy. Kalman Shan, M.D., F.C.C.P,  Pulmonary and Critical Care Medicine Staff Physician, [facility] Director - Interstitial Lung Disease  Program  P[facility] at [facility] [place], [place], [address]  Pager: [phone], If no answer or between  15:00h - 7:00h: call [phone] Telephone: [phone]  9:31 AM 03/04/2019

## 2019-03-04 NOTE — Patient Instructions (Signed)
ICD-10-CM   1. Chronic cough  R05   2. Cough variant asthma  J45.991   3. Irritable larynx syndrome  J38.7   4. Multiple lung nodules on CT  R91.8    #cough  - glad you are better after seeing Dr Joya Gaskins at [facility]  - continue symbicort -ensure refills  - do voice rehab per Dr Joya Gaskins  - talk less - mask - use KN95  - continue low risk covid activities - flu shot when able  #lung nodules - march 2020  - do followup CT chest in July-Sept 2021  [place] 2021 after CT

## 2019-03-11 ENCOUNTER — Other Ambulatory Visit: Payer: Self-pay | Admitting: Obstetrics and Gynecology

## 2019-03-11 ENCOUNTER — Telehealth: Payer: Self-pay | Admitting: Obstetrics and Gynecology

## 2019-03-11 NOTE — Telephone Encounter (Signed)
Spoke with patient. Advised 3D MMG is recommended yearly. Advised patient it is ultimately her decision if she chooses traditional or 3D MMG. Patient thankful for call. Questions answered.   Routing to provider for final review. Patient is agreeable to disposition. Will close encounter.

## 2019-03-11 NOTE — Telephone Encounter (Signed)
Patient want to find out if Dr Quincy Simmonds recommend she get a 3D mammogram this year. If not available,it's ok to leave a detailed message.

## 2019-03-16 ENCOUNTER — Other Ambulatory Visit: Payer: Self-pay | Admitting: Obstetrics and Gynecology

## 2019-03-16 DIAGNOSIS — Z1231 Encounter for screening mammogram for malignant neoplasm of breast: Secondary | ICD-10-CM

## 2019-03-24 ENCOUNTER — Telehealth: Payer: Self-pay | Admitting: Internal Medicine

## 2019-03-24 DIAGNOSIS — H5203 Hypermetropia, bilateral: Secondary | ICD-10-CM | POA: Diagnosis not present

## 2019-03-24 DIAGNOSIS — Z79899 Other long term (current) drug therapy: Secondary | ICD-10-CM | POA: Diagnosis not present

## 2019-03-24 DIAGNOSIS — H52203 Unspecified astigmatism, bilateral: Secondary | ICD-10-CM | POA: Diagnosis not present

## 2019-03-24 MED ORDER — ALBUTEROL SULFATE HFA 108 (90 BASE) MCG/ACT IN AERS: INHALATION_SPRAY | RESPIRATORY_TRACT | 1 refills | Status: DC

## 2019-03-24 NOTE — Telephone Encounter (Signed)
Rx for albuterol inhaler refilled  Spoke with the pt and notified that this was done  Nothing further needed

## 2019-03-27 ENCOUNTER — Other Ambulatory Visit: Payer: Self-pay

## 2019-03-27 MED ORDER — NIACIN 500 MG PO TABS: 500.0000 mg | ORAL_TABLET | Freq: Every day | ORAL | 2 refills | Status: AC

## 2019-04-28 DIAGNOSIS — I872 Venous insufficiency (chronic) (peripheral): Secondary | ICD-10-CM | POA: Diagnosis not present

## 2019-04-28 DIAGNOSIS — L230 Allergic contact dermatitis due to metals: Secondary | ICD-10-CM | POA: Diagnosis not present

## 2019-04-29 DIAGNOSIS — R490 Dysphonia: Secondary | ICD-10-CM | POA: Diagnosis not present

## 2019-04-29 DIAGNOSIS — R05 Cough: Secondary | ICD-10-CM | POA: Diagnosis not present

## 2019-04-29 DIAGNOSIS — K219 Gastro-esophageal reflux disease without esophagitis: Secondary | ICD-10-CM | POA: Diagnosis not present

## 2019-04-30 ENCOUNTER — Other Ambulatory Visit: Payer: Self-pay

## 2019-04-30 ENCOUNTER — Ambulatory Visit
Admission: RE | Admit: 2019-04-30 | Discharge: 2019-04-30 | Disposition: A | Discharge status: Home / Self Care | Payer: BC Managed Care – PPO | Source: Ambulatory Visit | Attending: Obstetrics and Gynecology | Admitting: Obstetrics and Gynecology

## 2019-04-30 DIAGNOSIS — Z1231 Encounter for screening mammogram for malignant neoplasm of breast: Secondary | ICD-10-CM

## 2019-05-11 ENCOUNTER — Other Ambulatory Visit: Payer: Self-pay | Admitting: Internal Medicine

## 2019-05-13 ENCOUNTER — Other Ambulatory Visit: Payer: Self-pay

## 2019-06-04 DIAGNOSIS — M542 Cervicalgia: Secondary | ICD-10-CM | POA: Diagnosis not present

## 2019-06-04 DIAGNOSIS — M1990 Unspecified osteoarthritis, unspecified site: Secondary | ICD-10-CM | POA: Diagnosis not present

## 2019-06-04 DIAGNOSIS — E559 Vitamin D deficiency, unspecified: Secondary | ICD-10-CM | POA: Diagnosis not present

## 2019-06-04 DIAGNOSIS — R21 Rash and other nonspecific skin eruption: Secondary | ICD-10-CM | POA: Diagnosis not present

## 2019-06-04 DIAGNOSIS — Z8349 Family history of other endocrine, nutritional and metabolic diseases: Secondary | ICD-10-CM | POA: Diagnosis not present

## 2019-06-06 ENCOUNTER — Other Ambulatory Visit: Payer: Self-pay | Admitting: Internal Medicine

## 2019-08-10 ENCOUNTER — Other Ambulatory Visit: Payer: Self-pay | Admitting: Internal Medicine

## 2019-11-09 ENCOUNTER — Other Ambulatory Visit: Payer: Self-pay

## 2019-11-09 MED ORDER — ESTRADIOL 10 MCG VA TABS: ORAL_TABLET | VAGINAL | 0 refills | Status: AC

## 2019-11-09 NOTE — Telephone Encounter (Signed)
Medication refill request: Vagifem Last AEX:  11/26/18 Dr. Edward Jolly Next AEX: none scheduled -- will call patient to schedule Last MMG (if hormonal medication request): 04/30/19 BIRADS 1 negative/density b Refill authorized: Please advise on refill; Order pended #24 w/0 refills if authorized

## 2019-12-01 ENCOUNTER — Other Ambulatory Visit: Payer: Self-pay | Admitting: Internal Medicine

## 2019-12-22 ENCOUNTER — Other Ambulatory Visit: Payer: BC Managed Care – PPO

## 2020-01-15 ENCOUNTER — Telehealth: Payer: Self-pay | Admitting: Internal Medicine

## 2020-01-15 NOTE — Telephone Encounter (Signed)
I spoke to the patient.  She provided me with her new insurance number.  I am working on getting this one taken care of.

## 2020-01-18 NOTE — Telephone Encounter (Signed)
LM on VM for [facility] in [place], [phone].

## 2020-01-19 NOTE — Telephone Encounter (Signed)
Pt schedule for 8/5 @ 11 w/ [facility], a [place] location.  Pt states nothing further needed at this time.

## 2020-01-21 ENCOUNTER — Other Ambulatory Visit: Payer: BLUE CROSS/BLUE SHIELD

## 2020-02-24 ENCOUNTER — Other Ambulatory Visit: Payer: Self-pay

## 2020-02-24 ENCOUNTER — Ambulatory Visit (INDEPENDENT_AMBULATORY_CARE_PROVIDER_SITE_OTHER): Payer: BLUE CROSS/BLUE SHIELD | Admitting: Internal Medicine

## 2020-02-24 ENCOUNTER — Encounter: Payer: Self-pay | Admitting: Internal Medicine

## 2020-02-24 VITALS — BP 116/80 | HR 81 | Temp 96.9°F | Ht 62.25 in | Wt 185.0 lb

## 2020-02-24 DIAGNOSIS — Z7185 Encounter for immunization safety counseling: Secondary | ICD-10-CM

## 2020-02-24 DIAGNOSIS — J45991 Cough variant asthma: Secondary | ICD-10-CM | POA: Diagnosis not present

## 2020-02-24 DIAGNOSIS — R918 Other nonspecific abnormal finding of lung field: Secondary | ICD-10-CM | POA: Diagnosis not present

## 2020-02-24 DIAGNOSIS — R053 Chronic cough: Secondary | ICD-10-CM

## 2020-02-24 DIAGNOSIS — R05 Cough: Secondary | ICD-10-CM

## 2020-02-24 DIAGNOSIS — J387 Other diseases of larynx: Secondary | ICD-10-CM | POA: Diagnosis not present

## 2020-02-24 DIAGNOSIS — Z7189 Other specified counseling: Secondary | ICD-10-CM

## 2020-02-24 DIAGNOSIS — J479 Bronchiectasis, uncomplicated: Secondary | ICD-10-CM

## 2020-02-24 MED ORDER — SPIRIVA RESPIMAT 1.25 MCG/ACT IN AERS: 2.0000 | INHALATION_SPRAY | Freq: Every day | RESPIRATORY_TRACT | 0 refills | Status: DC

## 2020-02-24 NOTE — Progress Notes (Signed)
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 600 MG 12 hr tablet, Take 600 mg by mouth 2 (two) times daily., Disp: , Rfl:  .  hydroxychloroquine (PLAQUENIL) 200 MG tablet, Take 400 mg by mouth 2 (two) times daily. , Disp: , Rfl:  .  meloxicam (MOBIC) 7.5 MG tablet, Take 7.5 mg by mouth daily as needed for pain., Disp: , Rfl:  .  Misc Natural Products (TURMERIC CURCUMIN) CAPS, Take 1 capsule by mouth daily., Disp: , Rfl:  .  Multiple Vitamin (MULTIVITAMIN) capsule, Take 1 capsule by mouth daily., Disp: , Rfl:  .  niacin 500 MG tablet, Take 500 mg by mouth at bedtime., Disp: , Rfl:  .  omeprazole (PRILOSEC) 20 MG capsule, Take 20 mg by mouth daily., Disp: , Rfl:  .  Respiratory Therapy Supplies (FLUTTER) DEVI, Use as directed, Disp: 1 each, Rfl: 0 .  SYMBICORT 80-4.5 MCG/ACT inhaler, INHALE 2 PUFFS TWICE A DAY, Disp: 30.6 Inhaler, Rfl: 6 .  traMADol (ULTRAM) 50 MG tablet, 1-2 every 4 hours as needed for cough or pain, Disp: 40 tablet, Rfl: 0 .  valACYclovir  (VALTREX) 1000 MG tablet, Take 2 g by mouth as needed., Disp: , Rfl:       Objective:   Vitals:   02/24/20 0920  BP: 116/80  Pulse: 81  Temp: (!) 96.9 F (36.1 C)  TempSrc: Temporal  SpO2: 94%  Weight: 185 lb (83.9 kg)  Height: 5' 2.25" (1.581 m)    Estimated body mass index is 33.57 kg/m as calculated from the following:   Height as of this encounter: 5' 2.25" (1.581 m).   Weight as of this encounter: 185 lb (83.9 kg).  @WEIGHTCHANGE @    02/24/20 0920  Weight: 185 lb (83.9 kg)     Physical Exam  General Appearance:    Alert, cooperative, no distress, appears stated age - yes , Deconditioned looking - no , OBESE  - no, Sitting on Wheelchair -  no  Head:    Normocephalic, without obvious abnormality, atraumatic  Eyes:    PERRL, conjunctiva/corneas clear,  Ears:    Normal TM's and external ear canals, both ears  Nose:   Nares normal, septum midline, mucosa normal, no drainage    or sinus tenderness. OXYGEN ON  - no . Patient is @ ra   Throat:   Lips, mucosa, and tongue normal; teeth and gums normal. Cyanosis on lips - no. POST NASAL DRIP +  Neck:   Supple, symmetrical, trachea midline, no adenopathy;    thyroid:  no enlargement/tenderness/nodules; no carotid   bruit or JVD  Back:     Symmetric, no curvature, ROM normal, no CVA tenderness  Lungs:     Distress - no , Wheeze no, Barrell Chest - no, Purse lip breathing - no, Crackles - no   Chest Wall:    No tenderness or deformity.    Heart:    Regular rate and rhythm, S1 and S2 normal, no rub   or gallop, Murmur - no  Breast Exam:    NOT DONE  Abdomen:     Soft, non-tender, bowel sounds active all four quadrants,    no masses, no organomegaly. Visceral obesity - no  Genitalia:   NOT DONE  Rectal:   NOT DONE  Extremities:   Extremities - normal, Has Cane - no, Clubbing - no, Edema - no  Pulses:   2+ and symmetric all extremities  Skin:   Stigmata of Connective Tissue Disease - no  Lymph nodes:    Cervical, supraclavicular, and axillary nodes normal  Psychiatric:  Neurologic:   Pleasant - yes, Anxious - no, Flat affect - no  CAm-ICU - neg, Alert and Oriented x 3 - yes, Moves all 4s - yes, Speech - normal, Cognition - intact           Assessment:       ICD-10-CM   1. Chronic cough  R05   2. Cough variant asthma  J45.991   3. Irritable larynx syndrome  J38.7   4. Multiple lung nodules on CT  R91.8   5. Bronchiectasis without complication (HCC)  J47.9   6. Vaccine counseling  Z71.89   7. Advice given about COVID-19 virus infection  Z71.89        Plan:     Patient Instructions     ICD-10-CM   1. Chronic cough  R05   2. Cough variant asthma  J45.991   3. Irritable larynx syndrome  J38.7   4. Multiple lung nodules on CT  R91.8   5. Bronchiectasis without complication (HCC)  J47.9    Cough improved but still persistent and severe and associated with nighttime wheezing You have new onset of very mild bronchiectasis in August 2021 CT scan done at [facility]  Plan -need to figure out if the asthma spun out of control or if this is all cough neuropathy related or because of the bronchiectasis   -Check blood CBC with differential and blood IgE -to be done outside the [facility] health system for insurance reasons -Check overnight oxygen study at your request -we will call you with the results -Try acupuncture for a few months to help cough -Continue regular medications [if you need refills let the CMA know]-including nasal inhaler and Symbicort. -Try empiric Spiriva inhaler [take 6-week worth of samples]  Follow-up -3-6 months or sooner if needed -if cough continues to persist at significant level then will have to do a bronchoscopy   ( Level 05 visit: Estb 40-54 min   in  visit type: on-site physical face to visit  in total care time and counseling or/and coordination of care by this undersigned MD - Dr Kalman Shan. This includes one or more of the following on this same  day 02/24/2020: pre-charting, chart review, note writing, documentation discussion of test results, diagnostic or treatment recommendations, prognosis, risks and benefits of management options, instructions, education, compliance or risk-factor reduction. It excludes time spent by the CMA or office staff in the care of the patient. Actual time 50 min)   SIGNATURE    Dr. Kalman Shan, M.D., F.C.C.P,  Pulmonary and Critical Care Medicine Staff Physician, [facility] Center Director - Interstitial Lung Disease  Program  Pulmonary Fibrosis [facility] Network at [facility] [place], [place], [address]  Pager: [phone], If no answer or between  15:00h - 7:00h: call [phone] Telephone: [phone]  9:56 AM 02/24/2020

## 2020-02-24 NOTE — Patient Instructions (Addendum)
ICD-10-CM   1. Chronic cough  R05   2. Cough variant asthma  J45.991   3. Irritable larynx syndrome  J38.7   4. Multiple lung nodules on CT  R91.8   5. Bronchiectasis without complication (HCC)  J47.9    Cough improved but still persistent and severe and associated with nighttime wheezing You have new onset of very mild bronchiectasis in August 2021 CT scan done at [name] -need to figure out if the asthma spun out of control or if this is all cough neuropathy related or because of the bronchiectasis   -Check blood CBC with differential and blood IgE -to be done outside the [facility] health system for insurance reasons -Check overnight oxygen study at your request -we will call you with the results -Try acupuncture for a few months to help cough -Continue regular medications [if you need refills let the CMA know]-including nasal inhaler and Symbicort. -Try empiric Spiriva inhaler [take 6-week worth of samples]  Follow-up -3-6 months or sooner if needed -if cough continues to persist at significant level then will have to do a bronchoscopy

## 2020-02-24 NOTE — Addendum Note (Signed)
Addended by: Legrand Pitts on: 02/24/2020 10:52 AM   Modules accepted: Orders

## 2020-03-10 ENCOUNTER — Other Ambulatory Visit: Payer: BLUE CROSS/BLUE SHIELD

## 2020-03-10 DIAGNOSIS — R053 Chronic cough: Secondary | ICD-10-CM

## 2020-03-14 LAB — IGE: IgE (Immunoglobulin E), Serum: 96 IU/mL (ref 6–495)

## 2020-03-31 ENCOUNTER — Telehealth: Payer: Self-pay | Admitting: Internal Medicine

## 2020-03-31 DIAGNOSIS — J9611 Chronic respiratory failure with hypoxia: Secondary | ICD-10-CM

## 2020-03-31 NOTE — Telephone Encounter (Signed)
Cassidy Walton with [address] [place] [address]  and Sep 11, 1960 date of birth  Had overnight oxygen study on 03/28/2020.  Time spent less than or equal to 88% was 150 minutes.  Lowest pulse ox was 83%.  Time consecutive less than or equal to 88% was 7 minutes approximate.  Awake pulse ox was 94%.  Lowest heart rate was 52 bpm.  Time spent in bradycardia 35%.  None of the time was spent in tachycardia.  Plan -Start 2 L nasal cannula oxygen

## 2020-03-31 NOTE — Telephone Encounter (Signed)
Attempted to call pt but unable to reach. Left message for her to return call. 

## 2020-04-01 ENCOUNTER — Telehealth: Payer: Self-pay | Admitting: Internal Medicine

## 2020-04-01 MED ORDER — ALBUTEROL SULFATE HFA 108 (90 BASE) MCG/ACT IN AERS: 2.0000 | INHALATION_SPRAY | Freq: Four times a day (QID) | RESPIRATORY_TRACT | 3 refills | Status: DC | PRN

## 2020-04-01 MED ORDER — SPIRIVA RESPIMAT 1.25 MCG/ACT IN AERS: 2.0000 | INHALATION_SPRAY | Freq: Every day | RESPIRATORY_TRACT | 5 refills | Status: DC

## 2020-04-01 NOTE — Telephone Encounter (Signed)
Called and spoke with patient. She states that she was shocked earlier when she spoke with Irving Burton about her needing oxygen when she sleeps and needed more clarification. Went over results with patient and let her know that order has been placed to get her oxygen. Patient is wondering if she needs it ASAP or if she would be ok through the weekend without it? Does she need to sleep sitting up or do anything different till she gets the oxygen?   She also feels like the Spiriva is working for her and would like a prescription sent into her   [name] please advise on O2 and prescription

## 2020-04-01 NOTE — Telephone Encounter (Signed)
Called and spoke with pt letting her know the info per MR and she verbalized understanding. Rx for spiriva has been sent to preferred pharmacy for pt. Pt wanted to go ahead and schedule appt for PFT and OV after with MR so this has been scheduled mid-Nov. Nothing further needed.

## 2020-04-01 NOTE — Telephone Encounter (Signed)
Called and spoke with pt letting her know the results of the ONO and that this does qualify her for 2L O2. Pt verbalized understanding. Order has been placed. Nothing further needed.

## 2020-04-01 NOTE — Telephone Encounter (Signed)
I was suproised too. The ONO was done at her request. Suspect sleep apnea. There is no urgency to starting it, . She can start 2L [place] and we can see (OR() we can reepeat test to be doubly sure.  Yes please send spiriva respimat 2 puff daily (1.25 strength)  Alsi  Do full PFT prior to return based on priopr OV

## 2020-04-04 ENCOUNTER — Telehealth: Payer: Self-pay | Admitting: Internal Medicine

## 2020-04-04 NOTE — Telephone Encounter (Signed)
pls have her sort her insurance issue out first or have [facility] double check first -  before we go ahead with PFTs, or ONO or do visit. Which network is she with? If [facility] or [facility], then I can suggest some names

## 2020-04-04 NOTE — Telephone Encounter (Signed)
Called and spoke to patient and relayed below recommendations. Patient stated that she checked about ONO, and that is affordable for her out of pocket.  She is wanting to know what the out of cost would be for PFT? PCC's can you guys help with this? Patient is in network with [facility].  [name], please advise.

## 2020-04-04 NOTE — Progress Notes (Signed)
IgE normal. We will NOT call you with this result. Will be discussed in context during an office visit

## 2020-04-04 NOTE — Telephone Encounter (Signed)
We do not have the cost I will forward to [facility]

## 2020-04-04 NOTE — Telephone Encounter (Signed)
Called and spoke with Patient's Husband.  Patient's husband stated they received a pulse ox through [facility] over the weekend, and have been checking sats.  Patient's sats are in mid 90's when her fingers are room temperature, or warm, and sats are in 80's when she has cold fingers. Advised pulse ox can pick up a wrong reading with cold fingers. Patient is concerned that when she took a ONO test last week, the readings were wrong, because her fingers may have been cold during test.  Patient is requesting to retake the ONO test, because they are afraid the readings were not, because Patient's fingers are often cold. Patient is also scheduled for a upcoming PFT.  Patient's Husband stated that they are currently paying out if pocket, because Patient has [address] care [address] and [place], and [employer] are not in network. Patient is wanting to know a cost for PFT? Advised that I would send our PCC's a message to see if Patient is in network, or if PFT could be performed at a different location?  Patient does not want to change providers, but is concerned she will have to, because of insurance.  Message routed to [name] and [facility] to advise

## 2020-04-05 NOTE — Telephone Encounter (Signed)
Called and spoke to patient last time we were given prices on PFT it was $175 - advised patient that it could have possibly increased in cost to estimate around $200. Patient okay with proceeding with PFT and f/u -pr

## 2020-04-25 ENCOUNTER — Telehealth: Payer: Self-pay | Admitting: Internal Medicine

## 2020-04-25 NOTE — Telephone Encounter (Signed)
Called and spoke with patient, she is wanting to cancel her PFT and f/u with MR because she is paying out of pocket and after 4 acupuncture treatments she is feeling much better, it has made a dramatic difference in her life.  She is also not wanting to have the ONO redone (no one has contacted her to redo the test).  When she wakes up during the night to go to the bathroom, she is checking her sats and it is 91-93% and during the day, her sats are 95-99%, she has not had a saturation of 99% in 2 years at home.  She is now sleeping flat with only one pillow.  She has had no prilosec in 5 days and she sees no difference in how she feels.  She is having no deep hurting coughing and is down to mild 2-3 coughing/day.  She is using using the albuterol inhaler 3 times per day, prior to her spiriva as the deep breath causes her to caugh, with the albuterol prior, she does not have a coughing fit.  She is wanting to know if she can follow up in December or January since she is much improved?  She is going to have better options for insurance where she will not be paying out of pocket for the next year.  [name], please advise.  Thank you.

## 2020-04-26 NOTE — Telephone Encounter (Signed)
Patient is available after 3:00 pm cell phone [phone].

## 2020-04-27 NOTE — Telephone Encounter (Signed)
Patient called for an update.  She is aware that MR is not available until 04/29/2020. She is okay with waiting on results.

## 2020-04-29 NOTE — Telephone Encounter (Signed)
Spoke with the pt and notified of response per MR  I have cancelled her appts and she will call after her new insurance kicks in to reschedule

## 2020-04-29 NOTE — Telephone Encounter (Signed)
Yes can see her in jan 2022 after better insurance. Jan 2022 schedule should open up soon

## 2020-05-03 ENCOUNTER — Ambulatory Visit: Payer: BLUE CROSS/BLUE SHIELD | Admitting: Internal Medicine

## 2020-05-26 ENCOUNTER — Other Ambulatory Visit: Payer: Self-pay | Admitting: Internal Medicine

## 2020-07-14 ENCOUNTER — Telehealth: Payer: Self-pay | Admitting: Internal Medicine

## 2020-07-14 NOTE — Telephone Encounter (Signed)
Called and spoke with patient who states that she has a question in regards to a booster vaccine and wanting to know if it is needed or not after speaking with rhumetologist. Needing advice asap bc they have appt for booster Monday. 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 Patient has 3 Pfizer vaccines as of right now. Also wanted to let you know that she did acupuncture and it worked wonders for her and to tell you thank you.  Please advise with recommendations

## 2020-07-15 NOTE — Telephone Encounter (Signed)
  If she has had 3 shots to covid vaccine -> that is it for now. IF she is worried about immediate immunity -> she can consider coming in and checking COVID IgG to give [place] a sense of how quick her immeidate imunity to covid is   That is great acupuncture helped her cough. I will keep this in in mind for other patients. Thanks for feedback   Immunization History  Administered Date(s) Administered  . PFIZER(Purple Top)SARS-COV-2 Vaccination 08/30/2019, 09/19/2019, 02/08/2020  . Tdap 11/26/2018

## 2020-07-15 NOTE — Telephone Encounter (Signed)
Called spoke with patient. She is not going to have the IgG done at this time. She will talk with [name] in March at her follow up  Nothing further needed at this time

## 2020-08-01 ENCOUNTER — Other Ambulatory Visit: Payer: Self-pay | Admitting: Internal Medicine

## 2020-08-16 ENCOUNTER — Ambulatory Visit (INDEPENDENT_AMBULATORY_CARE_PROVIDER_SITE_OTHER): Payer: 59 | Admitting: Internal Medicine

## 2020-08-16 ENCOUNTER — Other Ambulatory Visit: Payer: Self-pay

## 2020-08-16 ENCOUNTER — Encounter: Payer: Self-pay | Admitting: Internal Medicine

## 2020-08-16 VITALS — BP 112/72 | HR 87 | Temp 97.2°F | Ht 62.5 in | Wt 183.2 lb

## 2020-08-16 DIAGNOSIS — J387 Other diseases of larynx: Secondary | ICD-10-CM

## 2020-08-16 DIAGNOSIS — R918 Other nonspecific abnormal finding of lung field: Secondary | ICD-10-CM | POA: Diagnosis not present

## 2020-08-16 DIAGNOSIS — R053 Chronic cough: Secondary | ICD-10-CM

## 2020-08-16 DIAGNOSIS — J479 Bronchiectasis, uncomplicated: Secondary | ICD-10-CM

## 2020-08-16 NOTE — Patient Instructions (Addendum)
ICD-10-CM   1. Chronic cough  R05   2. Cough variant asthma  J45.991   3. Irritable larynx syndrome  J38.7   4. Multiple lung nodules on CT  R91.8   5. Bronchiectasis without complication (HCC)  J47.9    Cough improved especially with acupuncture, Spiriva and Symbicort  You have new onset of very mild bronchiectasis in August 2021 CT scan done at [facility] Blood IgE is normal  Multiple lung nodules noted in August 2021 CT scan at [facility]: Reported as benign without any further follow-up needed  Plan - - can wean down acupuncture to once a month and adjsut to cough accordingly  -You can attempt tapering down other medications related to cough such as chlorpheniramine, and  Pepcid, Prilosec- > stop 1 and then few weeks later stop the other  Continue Spiriva and Symbicort because these 2 are helping you  Follow-up - 6-. 9 months or sooner if needed

## 2020-08-16 NOTE — Progress Notes (Signed)
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 13.5     PFT  No flowsheet data found.     has a past medical history of Abnormal pap, Environmental allergies (04/19/15), Heart murmur, History of polymyalgia rheumatica, HSV-1 infection, Recurrent laryngeal neuropathy (2017), and Spastic colon.   reports that she has never smoked. She has never used smokeless tobacco.  Past Surgical History:  Procedure Laterality Date  . CHOLECYSTECTOMY    . cold knfe cone     CIN III.    Allergies  Allergen Reactions  . Bee Venom   . Codeine Sulfate     "makes me cry"  . Penicillins Hives    Fever, hallucation per pt    Immunization History  Administered Date(s) Administered  . PFIZER(Purple Top)SARS-COV-2 Vaccination 08/30/2019, 09/19/2019, 02/08/2020  . Tdap 11/26/2018    Family History  Problem Relation Age of Onset  . Heart failure Father   . Osteoarthritis Mother   . Hypertension Mother   . Obesity Sister    . Osteoarthritis Sister   . Osteoarthritis Brother   . Stroke Maternal Grandmother   . Heart disease Maternal Grandmother   . Hypertension Maternal Grandmother   . Stroke Paternal Grandmother   . Heart disease Paternal Grandmother   . Allergies Other        "everyone"  . Breast cancer Neg Hx      Current Outpatient Medications:  .  albuterol (VENTOLIN HFA) 108 (90 Base) MCG/ACT inhaler, TAKE 2  PUFFS BY MOUTH EVERY 6 HOURS AS NEEDED FOR WHEEZE OR SHORTNESS OF BREATH, Disp: 8.5 each, Rfl: 3 .  b complex vitamins tablet, Take 1 tablet by mouth every other day. , Disp: , Rfl:  .  chlorpheniramine (CHLOR-TRIMETON) 4 MG tablet, Take 4 mg by mouth at bedtime., Disp: , Rfl:  .  Ergocalciferol (VITAMIN D2) 2000 UNITS TABS, Take 1 tablet by mouth daily., Disp: , Rfl:  .  Estradiol (YUVAFEM) 10 MCG TABS vaginal tablet, PLACE ONE TABLET VAGINALLY TWICE A WEEK, Disp: 8 tablet, Rfl: 0 .  famotidine (PEPCID) 20 MG tablet, Take 20 mg by mouth at bedtime., Disp: , Rfl:  .  fluticasone (FLONASE) 50 MCG/ACT nasal spray, USE 2 SPRAYS IN EACH NOSTRIL EVERY DAY, Disp: 48 mL, Rfl: 0 .  guaiFENesin (MUCINEX) 600 MG 12 hr tablet, Take 600 mg by mouth 2 (two) times daily., Disp: , Rfl:  .  hydroxychloroquine (PLAQUENIL) 200 MG tablet, Take 200 mg by mouth 2 (two) times daily., Disp: , Rfl:  .  meloxicam (MOBIC) 7.5 MG tablet, Take 7.5 mg by mouth daily as needed for pain., Disp: , Rfl:  .  Misc Natural Products (TURMERIC CURCUMIN) CAPS, Take 1 capsule by mouth daily., Disp: , Rfl:  .  Multiple Vitamin (MULTIVITAMIN) capsule, Take 1 capsule by mouth daily., Disp: , Rfl:  .  niacin 500 MG tablet, Take 500 mg by mouth at bedtime., Disp: , Rfl:  .  omeprazole (PRILOSEC) 20 MG capsule, Take 20 mg by mouth daily., Disp: , Rfl:  .  Respiratory Therapy Supplies (FLUTTER) DEVI, Use as directed, Disp: 1 each, Rfl: 0 .  SYMBICORT 80-4.5 MCG/ACT inhaler, INHALE 2 PUFFS TWICE A DAY, Disp: 30.6 each, Rfl: 6 .  Tiotropium  Bromide Monohydrate (SPIRIVA RESPIMAT) 1.25 MCG/ACT AERS, Inhale 2 puffs into the lungs daily., Disp: 4 g, Rfl: 5 .  traMADol (ULTRAM) 50 MG tablet, 1-2 every 4 hours as needed for cough or pain, Disp: 40 tablet, Rfl: 0 .  valACYclovir (VALTREX) 1000 MG tablet, Take 2 g by mouth as needed., Disp: , Rfl:       Objective:   Vitals:   08/16/20 0925  BP: 112/72  Pulse: 87  Temp: (!) 97.2 F (36.2 C)  TempSrc: Oral  SpO2: 99%  Weight: 183 lb 3.2 oz (83.1 kg)  Height: 5' 2.5" (1.588 m)    Estimated body mass index is 32.97 kg/m as calculated from the following:   Height as of this encounter: 5' 2.5" (1.588 m).   Weight as of this encounter: 183 lb 3.2 oz (83.1 kg).  @WEIGHTCHANGE @    08/16/20 0925  Weight: 183 lb 3.2 oz (83.1 kg)     Physical Exam General: No distress. Looks well Neuro: Alert and Oriented x 3. GCS 15. Speech normal Psych: Pleasant Resp:  Barrel Chest - no.  Wheeze - no, Crackles - no, No overt respiratory distress CVS: Normal heart sounds. Murmurs - no Ext: Stigmata of Connective Tissue Disease - no HEENT: Normal upper airway. PEERL +. No post nasal drip        Assessment:     No diagnosis found.     Plan:     Patient Instructions     ICD-10-CM   1. Chronic cough  R05   2. Cough variant asthma  J45.991   3. Irritable larynx syndrome  J38.7   4. Multiple lung nodules on CT  R91.8   5. Bronchiectasis without complication (HCC)  J47.9  Cough improved especially with acupuncture, Spiriva and Symbicort  You have new onset of very mild bronchiectasis in August 2021 CT scan done at [facility] Blood IgE is normal  Multiple lung nodules noted in August 2021 CT scan at [facility]: Reported as benign without any further follow-up needed  Plan - - can wean down acupuncture to once a month and adjsut to cough accordingly  -You can attempt tapering down other medications related to cough such as chlorpheniramine, and  Pepcid,  Prilosec- > stop 1 and then few weeks later stop the other  Continue Spiriva and Symbicort because these 2 are helping you  Follow-up - 6-. 9 months or sooner if needed     SIGNATURE    Dr. Kalman Shan, M.D., F.C.C.P,  Pulmonary and Critical Care Medicine Staff Physician, [facility] Center Director - Interstitial Lung Disease  Program  Pulmonary Fibrosis [facility] [place], [place], [address]  Pager: [phone], If no answer or between  15:00h - 7:00h: call [phone] Telephone: [phone]  9:50 AM 08/16/2020

## 2020-09-22 ENCOUNTER — Other Ambulatory Visit: Payer: Self-pay | Admitting: Internal Medicine

## 2020-11-01 ENCOUNTER — Other Ambulatory Visit (HOSPITAL_BASED_OUTPATIENT_CLINIC_OR_DEPARTMENT_OTHER): Payer: Self-pay | Admitting: Family Medicine

## 2020-11-01 ENCOUNTER — Other Ambulatory Visit: Payer: Self-pay

## 2020-11-01 ENCOUNTER — Ambulatory Visit (HOSPITAL_BASED_OUTPATIENT_CLINIC_OR_DEPARTMENT_OTHER)
Admission: RE | Admit: 2020-11-01 | Discharge: 2020-11-01 | Disposition: A | Discharge status: Home / Self Care | Payer: 59 | Source: Ambulatory Visit | Attending: Family Medicine | Admitting: Family Medicine

## 2020-11-01 DIAGNOSIS — M7989 Other specified soft tissue disorders: Secondary | ICD-10-CM | POA: Insufficient documentation

## 2021-01-11 ENCOUNTER — Other Ambulatory Visit: Payer: Self-pay | Admitting: Internal Medicine

## 2021-01-11 DIAGNOSIS — M7989 Other specified soft tissue disorders: Secondary | ICD-10-CM

## 2021-01-12 ENCOUNTER — Ambulatory Visit
Admission: RE | Admit: 2021-01-12 | Discharge: 2021-01-12 | Disposition: A | Discharge status: Home / Self Care | Payer: 59 | Source: Ambulatory Visit | Attending: Internal Medicine | Admitting: Internal Medicine

## 2021-01-12 DIAGNOSIS — M7989 Other specified soft tissue disorders: Secondary | ICD-10-CM

## 2021-02-15 ENCOUNTER — Other Ambulatory Visit: Payer: Self-pay

## 2021-02-15 ENCOUNTER — Ambulatory Visit (INDEPENDENT_AMBULATORY_CARE_PROVIDER_SITE_OTHER): Payer: 59 | Admitting: Internal Medicine

## 2021-02-15 ENCOUNTER — Encounter: Payer: Self-pay | Admitting: Internal Medicine

## 2021-02-15 VITALS — BP 126/74 | HR 74 | Temp 98.0°F | Ht 62.5 in | Wt 187.6 lb

## 2021-02-15 DIAGNOSIS — R918 Other nonspecific abnormal finding of lung field: Secondary | ICD-10-CM | POA: Diagnosis not present

## 2021-02-15 DIAGNOSIS — R053 Chronic cough: Secondary | ICD-10-CM

## 2021-02-15 DIAGNOSIS — J479 Bronchiectasis, uncomplicated: Secondary | ICD-10-CM

## 2021-02-15 DIAGNOSIS — J387 Other diseases of larynx: Secondary | ICD-10-CM

## 2021-02-15 DIAGNOSIS — J45991 Cough variant asthma: Secondary | ICD-10-CM

## 2021-02-15 MED ORDER — FLUTICASONE PROPIONATE 50 MCG/ACT NA SUSP: NASAL | 3 refills | Status: DC

## 2021-02-15 NOTE — Progress Notes (Signed)
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 rough due to dust.   Chronic cough Cough variant asthma Irritable larynx syndrome  Multiple lung nodules on CT  HPI Cassidy Walton 60 y.o. -presents for follow-up.  After the last visit she is doing acupuncture once a month.  She weaned down several of cough medications particularly Prilosec and Pepcid.  She said the cough completely improved but then 10 days ago went to show.  Was 107 F.  Was very dusty it was outdoors.  She was not masks because she could not mask under that heat and humidity.  And then a cough flared up.  Current cough score reflects a flareup but it is 13 which is similar to previous cough score.  But she is beginning to feel better she does not need any specific treatment.  She wants to go back on the fluticasone and adjust her medications as needed which is fine.  She did ask about the COVID-vaccine booster.  She plans to get the new strain booster.  I advised her to be Masked especially in settings of indoor clusters of humans.  If she gets COVID to consider antiviral        Dr Gretta Cool Reflux  Symptom Index (> 13-15 suggestive of LPR cough)  07/27/2016  09/03/2016  02/07/2017  11/06/2017  08/06/2018  08/16/2020  02/15/2021   Hoarseness of problem with voice 4 1 0 0-4 4 1 3   Clearing  Of Throat 5 2.5 1 2 5 2 2   Excess throat mucus or feeling of post nasal drip 5 1 1 1 4 2 3   Difficulty swallowing food,  liquid or tablets 0 0 0 0 0 0 0  Cough after eating or lying down 5 3 0 4 5 3 2   Breathing difficulties or choking episodes 3 1 1  0 4 1.5 1  Troublesome or annoying cough 5 2 0 2-4 5 3 2   Sensation of something sticking in throat or lump in throat 4 0 0 0 2 1 0  Heartburn, chest pain, indigestion, or stomach acid coming up 0 0 1 2 1  0 0  TOTAL 31 10.5 4 11-17 30 13.5 13       has a past medical history of Abnormal pap, Environmental allergies (04/19/15), Heart murmur, History of polymyalgia rheumatica, HSV-1 infection, Recurrent laryngeal neuropathy (2017), and Spastic colon.   reports that she has never smoked. She has never used smokeless tobacco.  Past Surgical History:  Procedure Laterality Date   CHOLECYSTECTOMY     cold knfe cone     CIN III.    Allergies  Allergen Reactions   Bee Venom    Codeine Sulfate     "makes me cry"   Penicillins Hives    Fever, hallucation per pt    Immunization History  Administered Date(s) Administered   PFIZER(Purple Top)SARS-COV-2 Vaccination 08/30/2019, 09/19/2019, 02/08/2020   Tdap 11/26/2018    Family History  Problem Relation Age of Onset   Heart failure Father    Osteoarthritis Mother    Hypertension Mother    Obesity Sister    Osteoarthritis Sister    Osteoarthritis Brother    Stroke Maternal Grandmother    Heart disease Maternal Grandmother    Hypertension Maternal Grandmother    Stroke Paternal Grandmother    Heart disease Paternal Grandmother    Allergies Other        "everyone"   Breast cancer Neg Hx      Current Outpatient Medications:    albuterol (VENTOLIN HFA) 108 (90 Base) MCG/ACT inhaler, TAKE 2  PUFFS BY MOUTH EVERY 6 HOURS AS NEEDED FOR WHEEZE OR SHORTNESS OF BREATH, Disp: 8.5 each, Rfl: 3   b complex vitamins tablet, Take 1 tablet by mouth every other day. , Disp: , Rfl:    chlorpheniramine (CHLOR-TRIMETON) 4 MG tablet, Take 4 mg by mouth at bedtime., Disp: , Rfl:    Ergocalciferol (VITAMIN D2) 2000 UNITS TABS, Take 1 tablet by mouth daily., Disp: , Rfl:    Estradiol (YUVAFEM) 10 MCG TABS vaginal tablet, PLACE ONE TABLET VAGINALLY TWICE A WEEK, Disp: 8 tablet, Rfl: 0   fluticasone (FLONASE) 50 MCG/ACT nasal spray, USE 2 SPRAYS IN EACH NOSTRIL EVERY DAY, Disp: 48 mL, Rfl: 0   hydroxychloroquine (PLAQUENIL) 200 MG tablet, Take 200 mg by mouth 2 (two) times daily., Disp: , Rfl:    meloxicam (MOBIC) 7.5 MG tablet, Take 7.5 mg by mouth daily as needed for pain., Disp: , Rfl:    Misc Natural Products (TURMERIC CURCUMIN) CAPS, Take 1 capsule by mouth daily., Disp: , Rfl:    Multiple Vitamin (MULTIVITAMIN) capsule, Take 1 capsule by mouth daily., Disp: , Rfl:    niacin 500 MG tablet, Take 500 mg by mouth at bedtime., Disp: , Rfl:    SPIRIVA RESPIMAT 1.25 MCG/ACT AERS, INHALE 2 PUFFS BY MOUTH INTO THE LUNGS DAILY, Disp: 4 g, Rfl: 5   SYMBICORT 80-4.5 MCG/ACT inhaler, INHALE 2 PUFFS TWICE A DAY, Disp: 30.6 each, Rfl: 6   traMADol (ULTRAM) 50 MG tablet, 1-2 every 4 hours as needed for cough or pain, Disp: 40 tablet, Rfl: 0  valACYclovir (VALTREX) 1000 MG tablet, Take 2 g by mouth as needed., Disp: , Rfl:       Objective:   Vitals:   02/15/21 1346  BP: 126/74  Pulse: 74  Temp: 98 F (36.7 C)  TempSrc: Oral  SpO2: 98%  Weight: 187 lb 9.6 oz (85.1 kg)  Height: 5' 2.5" (1.588 m)    Estimated body mass index is 33.77 kg/m as calculated from the following:   Height as of this encounter: 5' 2.5" (1.588 m).   Weight as of this encounter: 187 lb 9.6 oz (85.1 kg).  @  [employer]   02/15/21 1346  Weight: 187 lb 9.6 oz (85.1 kg)     Physical Exam  General: No  distress. Look swell Neuro: Alert and Oriented x 3. GCS 15. Speech normal Psych: Pleasant Resp:  Barrel Chest - no.  Wheeze - no, Crackles - no, No overt respiratory distress CVS: Normal heart sounds. Murmurs - no Ext: Stigmata of Connective Tissue Disease - no HEENT: Normal upper airway. PEERL +. No post nasal drip        Assessment:       ICD-10-CM   1. Chronic cough  R05.3     2. Irritable larynx syndrome  J38.7     3. Multiple lung nodules on CT  R91.8     4. Bronchiectasis without complication (HCC)  J47.9     5. Cough variant asthma  J45.991          Plan:     Patient Instructions     ICD-10-CM   1. Chronic cough  R05   2. Cough variant asthma  J45.991   3. Irritable larynx syndrome  J38.7   4. Multiple lung nodules on CT  R91.8   5. Bronchiectasis without complication (HCC)  J47.9    Cough improved  stable. You have new onset of very mild bronchiectasis in August 2021 CT scan done at [facility] but Blood IgE is normal. Multiple lung nodules noted in August 2021 CT scan at [facility]: Reported as benign without any further follow-up needed  Plan - - ok to continue acupunctureonce a month and adjsut to cough accordingly  - Take chlorpheniramine, and  Pepcid, Prilosec as needed  - Continue Spiriva and Symbicort because these 2 are helping you  - refill  take generic fluticasone inhaler 2 squirts each nostril daily as meeeded   Follow-up - 9 months or sooner if needed' RSI cough score at followup    SIGNATURE    Dr. Kalman Shan, M.D., F.C.C.P,  Pulmonary and Critical Care Medicine Staff Physician, [facility] Director - Interstitial Lung Disease  Program  Pulmonary Fibrosis [facility] at [facility] [place], [place], [address]  Pager: [phone], If no answer or between  15:00h - 7:00h: call [phone] Telephone: [phone]  2:13 PM 02/15/2021

## 2021-02-15 NOTE — Patient Instructions (Addendum)
ICD-10-CM   1. Chronic cough  R05   2. Cough variant asthma  J45.991   3. Irritable larynx syndrome  J38.7   4. Multiple lung nodules on CT  R91.8   5. Bronchiectasis without complication (HCC)  J47.9    Cough improved  stable. You have new onset of very mild bronchiectasis in August 2021 CT scan done at [facility] but Blood IgE is normal. Multiple lung nodules noted in August 2021 CT scan at [facility]: Reported as benign without any further follow-up needed  Plan - - ok to continue acupunctureonce a month and adjsut to cough accordingly  - Take chlorpheniramine, and  Pepcid, Prilosec as needed  - Continue Spiriva and Symbicort because these 2 are helping you  - refill  take generic fluticasone inhaler 2 squirts each nostril daily as meeeded   Follow-up - 9 months or sooner if needed' RSI cough score at followup

## 2021-02-15 NOTE — Addendum Note (Signed)
Addended by: Wyvonne Lenz on: 02/15/2021 02:24 PM   Modules accepted: Orders

## 2021-03-25 ENCOUNTER — Other Ambulatory Visit: Payer: Self-pay | Admitting: Internal Medicine

## 2021-05-24 ENCOUNTER — Other Ambulatory Visit: Payer: Self-pay

## 2021-05-24 ENCOUNTER — Telehealth: Payer: Self-pay | Admitting: Internal Medicine

## 2021-05-24 ENCOUNTER — Telehealth (INDEPENDENT_AMBULATORY_CARE_PROVIDER_SITE_OTHER): Payer: 59 | Admitting: Internal Medicine

## 2021-05-24 DIAGNOSIS — U071 COVID-19: Secondary | ICD-10-CM

## 2021-05-24 NOTE — Telephone Encounter (Signed)
I believe pharmacy can directly dispense paxlovid without a script.  Please checkw th pharmacy. But in absence of that   PAXLOVID   Paxlovid (nirmatelvir 300/Ritonavir100) - BID x 5 days - for GFR >= 60 - assmes normal renal function. No bmet   xxxxxxxx    PLEASE CHECK MED LIST for the following issues. Please check the 2 different condition related to concomitant medications in alphabetical order  If Cassidy Walton with DOB 1961-04-10 is on any of the following Strong CYP3A inhibtors - this patient Cassidy Walton should withold these concomitant meds so they can start paxlovid stratight away. If taking any of these:  alfuzosin, amiodarone, clozapine, colchicine, dihydroergotamine, dronedarone, ergotamine, flecainide, lovastatin, lurasidone, methylergonovine, midazolam [oral], pethidine, pimozide, propafenone, propoxyphene, quinidine, ranolazine, sildenafil simvastatin, triazolam).   If   Cassidy Walton  with dob 04/19/1961 Is on any of these other strong CYP3A inducers then starting paxlovid should be delayed and the following meds should wash out first. These are dapalutamide, carbamazepine, phenobarbital, phenytoin, rifampin, St John's wort) - let me know immediately and we should delay starting paxlovid by some days even if he stops these medication.    PLEASE INFORM Cassidy Walton  OF FOLLOWING SIDE EFFECTS  Side effects - all < 5%  - skin rash (and veyr rare a conditon called TEN) - angiomedia  - myalgia - jaundice - high bP (1%) - loss of taste  - diarrhea - 25% rebound

## 2021-05-24 NOTE — Telephone Encounter (Signed)
Spoke with the pt  She is asking for further guidance and had multiple questions regarding antiviral  I set her up for video visit with MR at 2 pm today

## 2021-05-24 NOTE — Progress Notes (Signed)
OV 05/24/2021  Subjective:  Patient ID: Cassidy Walton, female , DOB: Feb 09, 1961 , age 60 y.o. , MRN: [id number] , ADDRESS: [address] [place] [address] PCP Shirlean Mylar, MD (Inactive) Patient Care Team: Shirlean Mylar, MD (Inactive) as PCP - General (Family Medicine)  This Provider for this visit: Treatment Team:  Attending Provider: Kalman Shan, MD  Type of visit: Video - ACUTE VISIT Circumstance: COVID-19 national emergency Identification of patient Cassidy Walton with 1961-05-14 and MRN [id number] - 2 person identifier Risks: Risks, benefits, limitations of telephone visit explained. Patient understood and verbalized agreement to proceed Anyone else on call: only patient Patient location: her home This provider location: [address]; [place]; [place] [address]. [facility]. [phone]    05/24/2021 -   ACUTE VISIT    HPI Cassidy Walton 60 y.o. -patient tested positive for covid this 05/24/2021 morniing. Husband tested positive 2d ago. Was in  a dry building for tradeshow. Sinuses dried out from trade show. Overall asymptomatic other than mild swimmig of head. Has has had bivalent booster. She is really questioning need for paxlovid. Explained risk v benefit ratio - her overall risk of of severe disease is very low because she is vaccinated and overall minimal health problems other than the fact she is age 31.  However there is benefit in lowering the risk of admission with antiviral.  The other benefit is to improve symptoms but this has to be balanced with risk of rebound and minimal side effects.  Based on all this we took a shared decision making that she will wait and take the antiviral if she gets symptoms.  We talked about risk of contagion.  I told her that 11 days she could be contagious unless within those 11 days she turns antigen test negative x2 consecutive days.  Explained to her the difference with an antigen test and PCR test.   Explained that PCR test can be positive even after 11 days but that does not mean she is contagious.  This advised her to follow antigen testing and symptoms.  She will call the pharmacy directly if needed for antiviral.    CT Chest data  No results found.    PFT  No flowsheet data found.     has a past medical history of Abnormal pap, Environmental allergies (04/19/15), Heart murmur, History of polymyalgia rheumatica, HSV-1 infection, Recurrent laryngeal neuropathy (2017), and Spastic colon.   reports that she has never smoked. She has never used smokeless tobacco.  Past Surgical History:  Procedure Laterality Date   CHOLECYSTECTOMY     cold knfe cone     CIN III.    Allergies  Allergen Reactions   Bee Venom    Codeine Sulfate     "makes me cry"   Penicillins Hives    Fever, hallucation per pt    Immunization History  Administered Date(s) Administered   PFIZER(Purple Top)SARS-COV-2 Vaccination 08/30/2019, 09/19/2019, 02/08/2020   Tdap 11/26/2018    Family History  Problem Relation Age of Onset   Heart failure Father    Osteoarthritis Mother    Hypertension Mother    Obesity Sister    Osteoarthritis Sister    Osteoarthritis Brother    Stroke Maternal Grandmother    Heart disease Maternal Grandmother    Hypertension Maternal Grandmother    Stroke Paternal Grandmother    Heart disease Paternal Grandmother    Allergies Other        "  everyone"   Breast cancer Neg Hx      Current Outpatient Medications:    albuterol (VENTOLIN HFA) 108 (90 Base) MCG/ACT inhaler, TAKE 2 PUFFS BY MOUTH EVERY 6 HOURS AS NEEDED FOR WHEEZE OR SHORTNESS OF BREATH, Disp: 8.5 each, Rfl: 3   b complex vitamins tablet, Take 1 tablet by mouth every other day. , Disp: , Rfl:    chlorpheniramine (CHLOR-TRIMETON) 4 MG tablet, Take 4 mg by mouth at bedtime., Disp: , Rfl:    Ergocalciferol (VITAMIN D2) 2000 UNITS TABS, Take 1 tablet by mouth daily., Disp: , Rfl:    Estradiol (YUVAFEM) 10  MCG TABS vaginal tablet, PLACE ONE TABLET VAGINALLY TWICE A WEEK, Disp: 8 tablet, Rfl: 0   fluticasone (FLONASE) 50 MCG/ACT nasal spray, USE 2 SPRAYS IN EACH NOSTRIL EVERY DAY, Disp: 48 mL, Rfl: 3   hydroxychloroquine (PLAQUENIL) 200 MG tablet, Take 200 mg by mouth 2 (two) times daily., Disp: , Rfl:    meloxicam (MOBIC) 7.5 MG tablet, Take 7.5 mg by mouth daily as needed for pain., Disp: , Rfl:    Misc Natural Products (TURMERIC CURCUMIN) CAPS, Take 1 capsule by mouth daily., Disp: , Rfl:    Multiple Vitamin (MULTIVITAMIN) capsule, Take 1 capsule by mouth daily., Disp: , Rfl:    niacin 500 MG tablet, Take 500 mg by mouth at bedtime., Disp: , Rfl:    SYMBICORT 80-4.5 MCG/ACT inhaler, INHALE 2 PUFFS TWICE A DAY, Disp: 30.6 each, Rfl: 6   Tiotropium Bromide Monohydrate (SPIRIVA RESPIMAT) 1.25 MCG/ACT AERS, INHALE 2 PUFFS BY MOUTH INTO THE LUNGS DAILY, Disp: 4 g, Rfl: 5   traMADol (ULTRAM) 50 MG tablet, 1-2 every 4 hours as needed for cough or pain, Disp: 40 tablet, Rfl: 0   valACYclovir (VALTREX) 1000 MG tablet, Take 2 g by mouth as needed., Disp: , Rfl:       Objective:   There were no vitals filed for this visit.  Estimated body mass index is 33.77 kg/m as calculated from the following:   Height as of 02/15/21: 5' 2.5" (1.588 m).   Weight as of 02/15/21: 187 lb 9.6 oz (85.1 kg).  @WEIGHTCHANGE @  There were no vitals filed for this visit.   Physical Exam  Looks well and cheerful on the video    Assessment:       ICD-10-CM   1. COVID-19 virus infection  U07.1          Plan:     Patient Instructions     ICD-10-CM   1. COVID-19 virus infection  U07.1       See discussion  ( Level 02 visit: Estb 10-19 min for this visit type which was visit type: video virtual visit in total care time and counseling or/and coordination of care by this undersigned MD - Dr . This includes one or more of the following for care delivered on 05/24/2021 same day: pre-charting, chart  review, note writing, documentation discussion of test results, diagnostic or treatment recommendations, prognosis, risks and benefits of management options, instructions, education, compliance or risk-factor reduction. It excludes time spent by the CMA or office staff in the care of the patient. Actual time was 13 min)   SIGNATURE    Dr. 14/12/2020, M.D., F.C.C.P,  Pulmonary and Critical Care Medicine Staff Physician, [facility] Director - Interstitial Lung Disease  Program  Pulmonary Fibrosis [facility] at [facility] [place], [place], [place]  Pager: [phone], If no  answer or between  15:00h - 7:00h: call [phone] Telephone: [phone]  2:30 PM 05/24/2021

## 2021-05-24 NOTE — Telephone Encounter (Signed)
Spoke with the pt  She states that she was exposed to Covid on recent trip to Oct 2022  She took an at home test this am and tested pos  She is not having any symptoms, just wanted to let Dr Marchelle Gearing know  Please advise if you want to send her anything  She has had 4 covid vaccines

## 2021-05-24 NOTE — Patient Instructions (Signed)
ICD-10-CM   1. COVID-19 virus infection  U07.1       See discussion

## 2021-05-25 ENCOUNTER — Telehealth: Payer: Self-pay | Admitting: Internal Medicine

## 2021-05-25 MED ORDER — NIRMATRELVIR/RITONAVIR (PAXLOVID)TABLET: 3.0000 | ORAL_TABLET | Freq: Two times a day (BID) | ORAL | 0 refills | Status: AC

## 2021-05-25 NOTE — Telephone Encounter (Signed)
Rx for paxlovid has been sent to pharmacy for pt. Called and spoke with pt letting her know know this had been done and she verbalized understanding. Nothing further needed.

## 2021-06-01 ENCOUNTER — Other Ambulatory Visit: Payer: Self-pay | Admitting: Internal Medicine

## 2021-08-21 NOTE — Progress Notes (Signed)
61 y.o. G0P0000 Single Caucasian female here for annual exam.   ? ?No specific concerns today.  ?Lesion on left vulvar area that was painful. ?She squeezed the area of her vulva. ?Has urinary incontinence and uses pads.  Wonders if the pad contributed to this. ? ?Forgetting to use Vagifem.  ?Uses once a week. ? ?Has seen dermatology for her rash. ? ?PCP:  [facility] ? ?Patient's last menstrual period was 03/24/2011.     ?  ?    ?Sexually active: Yes.    ?The current method of family planning is post menopausal status.    ?Exercising: Yes.     Walks 45 minutes to 1 hour daily ?Smoker:  no ? ?Health Maintenance: ?Pap:   11-26-18 Neg:Neg HR HPV, 10/02/17 Neg:Neg HR HPV; 09/26/16 Neg:Pos HR HPV -- Neg genotype for 16/18/45 ?History of abnormal Pap:  Yes, hx of CIN III 1991, normal since ?MMG:  04-30-19 Neg/Birads1--has appt. soon ?Colonoscopy:  11/2016 ?BMD:  years ago  Result ;normal ?TDaP:  11-26-18 ?Gardasil:   n/a ?HIV: 09-19-15 Neg ?Hep C: 09-19-15 Neg ?Screening Labs:  PCP ? ? reports that she has never smoked. She has never used smokeless tobacco. She reports current alcohol use of about 4.0 - 5.0 standard drinks per week. She reports that she does not use drugs. ? ?Past Medical History:  ?Diagnosis Date  ? Abnormal pap   ? CIN III  ? Environmental allergies 04/19/15  ? Heart murmur   ? History of polymyalgia rheumatica   ? HSV-1 infection   ? Recurrent laryngeal neuropathy 2017  ? Spastic colon   ? hx of Spastic colon  ? ? ?Past Surgical History:  ?Procedure Laterality Date  ? CHOLECYSTECTOMY    ? cold knfe cone    ? CIN III.  ? ? ?Current Outpatient Medications  ?Medication Sig Dispense Refill  ? albuterol (VENTOLIN HFA) 108 (90 Base) MCG/ACT inhaler 2 puffs as needed    ? b complex vitamins tablet Take 1 tablet by mouth every other day.     ? budesonide-formoterol (SYMBICORT) 80-4.5 MCG/ACT inhaler 2 puffs    ? Ergocalciferol (VITAMIN D2) 2000 UNITS TABS Take 1 tablet by mouth daily.    ? Estradiol (YUVAFEM) 10  MCG TABS vaginal tablet PLACE ONE TABLET VAGINALLY TWICE A WEEK 8 tablet 0  ? fluticasone (FLONASE) 50 MCG/ACT nasal spray USE 2 SPRAYS IN EACH NOSTRIL EVERY DAY 48 mL 3  ? hydroxychloroquine (PLAQUENIL) 200 MG tablet Take 200 mg by mouth 2 (two) times daily.    ? meloxicam (MOBIC) 7.5 MG tablet Take 7.5 mg by mouth daily as needed for pain.    ? Misc Natural Products (TURMERIC CURCUMIN) CAPS Take 1 capsule by mouth daily.    ? Multiple Vitamin (MULTIVITAMIN) capsule Take 1 capsule by mouth daily.    ? niacin 500 MG tablet Take 500 mg by mouth at bedtime.    ? Tiotropium Bromide Monohydrate (SPIRIVA RESPIMAT) 1.25 MCG/ACT AERS INHALE 2 PUFFS BY MOUTH INTO THE LUNGS DAILY 4 g 5  ? traMADol (ULTRAM) 50 MG tablet 1 tablet as needed    ? valACYclovir (VALTREX) 1000 MG tablet Take 2 g by mouth as needed.    ? ?No current facility-administered medications for this visit.  ? ? ?Family History  ?Problem Relation Age of Onset  ? Heart failure Father   ? Osteoarthritis Mother   ? Hypertension Mother   ? Obesity Sister   ? Osteoarthritis Sister   ? Osteoarthritis  Brother   ? Stroke Maternal Grandmother   ? Heart disease Maternal Grandmother   ? Hypertension Maternal Grandmother   ? Stroke Paternal Grandmother   ? Heart disease Paternal Grandmother   ? Allergies Other   ?     "everyone"  ? Breast cancer Neg Hx   ? ? ?Review of Systems  ?Genitourinary:   ?     Stress urinary incontinence with coughing  ?All other systems reviewed and are negative. ? ?Exam:   ?BP 118/72   Pulse 78   Ht 5' 1.75" (1.568 m)   Wt 185 lb (83.9 kg)   LMP 03/24/2011   SpO2 95%   BMI 34.11 kg/m?     ?General appearance: alert, cooperative and appears stated age ?Head: normocephalic, without obvious abnormality, atraumatic ?Neck: no adenopathy, supple, symmetrical, trachea midline and thyroid normal to inspection and palpation ?Lungs: clear to auscultation bilaterally ?Breasts: normal appearance, no masses or tenderness, No nipple retraction or  dimpling, No nipple discharge or bleeding, No axillary adenopathy ?Heart: regular rate and rhythm ?Abdomen: soft, non-tender; no masses, no organomegaly ?Extremities: extremities normal, atraumatic, no cyanosis or edema ?Skin: skin color, texture, turgor normal. Red raised rash on left anterior tibial surface. ?Lymph nodes: cervical, supraclavicular, and axillary nodes normal. ?Neurologic: grossly normal ? ?Pelvic: External genitalia:  3 mm sebaceous cyst of the left labia majora. ?             No abnormal inguinal nodes palpated. ?             Urethra:  normal appearing urethra with no masses, tenderness or lesions ?             Bartholins and Skenes: normal    ?             Vagina: normal appearing vagina with normal color and discharge, no lesions ?             Cervix: no lesions ?             Pap taken: yes ?Bimanual Exam:  Uterus:  normal size, contour, position, consistency, mobility, non-tender ?             Adnexa: no mass, fullness, tenderness ?             Rectal exam: yes.  Confirms. ?             Anus:  normal sphincter tone, no lesions ? ?Chaperone was present for exam:  Ladona Ridgel, CMA ? ?Assessment:   ?Well woman visit with gynecologic exam. ?Hx CIN III in 1991. ?Hx normal pap and positive HR HPV with negative 16/18/45 in 2018. ?Hx polymyalgia rheumatica. On imunosupressive medication.  ?Hx HTN, coronary atherosclerosis, hyperlipidemia.  ?Vaginal atrophy.   ?GSI.  ?Hx HSV 1.  ? ?Plan: ?Mammogram screening discussed. ?Self breast awareness reviewed. ?Pap and HR HPV as above. ?Guidelines for Calcium, Vitamin D, regular exercise program including cardiovascular and weight bearing exercise. ?She will let me know if she wants a refill of her Vagifem after her mammogram is back and normal.  ?Follow up annually and prn.  ? ?After visit summary provided.  ? ? ? ?

## 2021-08-22 ENCOUNTER — Other Ambulatory Visit (HOSPITAL_COMMUNITY)
Admission: RE | Admit: 2021-08-22 | Discharge: 2021-08-22 | Disposition: A | Discharge status: Home / Self Care | Payer: Self-pay | Source: Ambulatory Visit | Attending: Obstetrics and Gynecology | Admitting: Obstetrics and Gynecology

## 2021-08-22 ENCOUNTER — Ambulatory Visit (INDEPENDENT_AMBULATORY_CARE_PROVIDER_SITE_OTHER): Payer: Managed Care, Other (non HMO) | Admitting: Obstetrics and Gynecology

## 2021-08-22 ENCOUNTER — Other Ambulatory Visit: Payer: Self-pay

## 2021-08-22 ENCOUNTER — Encounter: Payer: Self-pay | Admitting: Obstetrics and Gynecology

## 2021-08-22 VITALS — BP 118/72 | HR 78 | Ht 61.75 in | Wt 185.0 lb

## 2021-08-22 DIAGNOSIS — Z01419 Encounter for gynecological examination (general) (routine) without abnormal findings: Secondary | ICD-10-CM

## 2021-08-22 DIAGNOSIS — Z124 Encounter for screening for malignant neoplasm of cervix: Secondary | ICD-10-CM | POA: Diagnosis not present

## 2021-08-22 NOTE — Patient Instructions (Signed)

## 2021-08-24 LAB — CYTOLOGY - PAP
Comment: NEGATIVE
Diagnosis: UNDETERMINED — AB
High risk HPV: NEGATIVE

## 2021-09-14 ENCOUNTER — Telehealth: Payer: Self-pay | Admitting: Internal Medicine

## 2021-09-14 NOTE — Telephone Encounter (Signed)
Patient is calling in regards to her Symbicort prescription. She has changed insurance to [place]. They do not cover Symbicort, they do cover wilexa. She wants to know if she can take the wilexa or if MR knows of any other altenatives that her ins. would pay for. She runs out of her symbicort on Monday ?

## 2021-09-15 NOTE — Telephone Encounter (Signed)
Attempted to call pt but unable to reach. Left message for her to return call. 

## 2021-09-18 NOTE — Telephone Encounter (Signed)
Called patient but she did not answer. Left message for her to call back.  

## 2021-09-18 NOTE — Telephone Encounter (Signed)
Patient is returning phone call. Patient phone number is [phone]. ?

## 2021-09-19 NOTE — Telephone Encounter (Signed)
Patient is returning phone call. Patient phone number is [phone]. ?

## 2021-09-19 NOTE — Telephone Encounter (Signed)
Called and spoke with patient. Her insurance has changed and they do not cover symbicort and she wants to know if there are any alternatives that would be covered with [place].  ? ?[name], please advise ?

## 2021-09-20 ENCOUNTER — Other Ambulatory Visit (HOSPITAL_COMMUNITY): Payer: Self-pay

## 2021-09-20 MED ORDER — FLUTICASONE-SALMETEROL 250-50 MCG/ACT IN AEPB: 1.0000 | INHALATION_SPRAY | Freq: Two times a day (BID) | RESPIRATORY_TRACT | 5 refills | Status: DC

## 2021-09-20 NOTE — Telephone Encounter (Signed)
Patient checking on message for inhaler. Patient phone number is [phone]. ?

## 2021-09-20 NOTE — Telephone Encounter (Signed)
Called and spoke with pt letting her know the info per MR and she verbalized understanding. Rx for [place] has been sent to pharmacy for pt. Nothing further needed. ?

## 2021-09-20 NOTE — Telephone Encounter (Signed)
Routing to prior auth team. Please advise. ?

## 2021-09-20 NOTE — Telephone Encounter (Signed)
This question should be asked to our pharmacy team or patient should call [employer] and find out. I do not know but I am fine wih wixela, dulera, breo ?

## 2022-01-29 ENCOUNTER — Ambulatory Visit (INDEPENDENT_AMBULATORY_CARE_PROVIDER_SITE_OTHER): Payer: Commercial Managed Care - HMO | Admitting: Internal Medicine

## 2022-01-29 ENCOUNTER — Encounter: Payer: Self-pay | Admitting: Internal Medicine

## 2022-01-29 VITALS — BP 126/68 | HR 76 | Temp 98.3°F | Ht 61.0 in | Wt 183.2 lb

## 2022-01-29 DIAGNOSIS — J45991 Cough variant asthma: Secondary | ICD-10-CM

## 2022-01-29 DIAGNOSIS — J387 Other diseases of larynx: Secondary | ICD-10-CM | POA: Diagnosis not present

## 2022-01-29 DIAGNOSIS — R053 Chronic cough: Secondary | ICD-10-CM | POA: Diagnosis not present

## 2022-01-29 NOTE — Patient Instructions (Addendum)
ICD-10-CM   1. Chronic cough  R05   2. Cough variant asthma  J45.991   3. Irritable larynx syndrome  J38.7   4. Multiple lung nodules on CT  R91.8   5. Bronchiectasis without complication (HCC)  J47.9    Cough improved  stable. OFf spiriva   You have new onset of very mild bronchiectasis in August 2021 CT scan done at [facility] but Blood IgE is normal. Multiple lung nodules noted in August 2021 CT scan at [facility]: Reported as benign without any further follow-up needed  Plan - - ok to continue acupunctureonce a month and adjsut to cough accordingly  - Glad off spiriva and off flonase  - - Continue Wixela scheduled  - flu shot in fall - RSV vacccine in fall 2023 if you meet criteria - covid mRNA booster when new strain booster is released  Follow-up - 9-12 months or sooner if needed' RSI cough score at followup
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
## 2022-03-22 ENCOUNTER — Other Ambulatory Visit: Payer: Self-pay | Admitting: Internal Medicine

## 2022-05-29 ENCOUNTER — Telehealth: Payer: Self-pay | Admitting: Internal Medicine

## 2022-05-29 NOTE — Telephone Encounter (Signed)
Pt wants to know if [employer] will cover a specific asthma medication (Bereva Respiamat) Changing insurance and needs to know by tomorrow please.

## 2022-05-30 NOTE — Telephone Encounter (Signed)
Called patient and explained to her that we are unable to know exactly what medications insurance covers until she is under that policy. I told her to call the insurance company and see if she can get an update on what is covered and what is not. Nothing further needed

## 2022-06-20 ENCOUNTER — Other Ambulatory Visit (HOSPITAL_COMMUNITY): Payer: Self-pay

## 2022-06-20 ENCOUNTER — Telehealth: Payer: Self-pay | Admitting: Internal Medicine

## 2022-06-20 MED ORDER — FLUTICASONE-SALMETEROL 250-50 MCG/ACT IN AEPB: 1.0000 | INHALATION_SPRAY | Freq: Two times a day (BID) | RESPIRATORY_TRACT | 5 refills | Status: DC

## 2022-06-20 NOTE — Telephone Encounter (Signed)
Called patient and left message the Cassidy Walton was sent into pharmacy. And that we got a return message from pharmacy team that Cassidy Walton is covered under new plan. Nothing further needed

## 2022-06-20 NOTE — Telephone Encounter (Signed)
PT is calling asking for Dr. Donald Prose nurse.She has changed her Insurance and needs advise on what asthma medication to change to. TY. [phone]

## 2022-06-20 NOTE — Telephone Encounter (Signed)
Per benefits investigation; Cassidy Walton is still showing as covered under the [employer].

## 2022-06-20 NOTE — Telephone Encounter (Signed)
Called patient and she states that she switched insurance companies and now she is with [employer] and not [employer] anymore. She states that she is having trouble with trying to find out what is covered. She takes Wixella now   Can we run a ticket to see what is covered with new Haematologist for this patient  Thank you

## 2022-07-23 DIAGNOSIS — L2089 Other atopic dermatitis: Secondary | ICD-10-CM | POA: Diagnosis not present

## 2022-08-15 DIAGNOSIS — E559 Vitamin D deficiency, unspecified: Secondary | ICD-10-CM | POA: Diagnosis not present

## 2022-08-15 DIAGNOSIS — M064 Inflammatory polyarthropathy: Secondary | ICD-10-CM | POA: Diagnosis not present

## 2022-08-15 DIAGNOSIS — M25562 Pain in left knee: Secondary | ICD-10-CM | POA: Diagnosis not present

## 2022-08-15 DIAGNOSIS — M542 Cervicalgia: Secondary | ICD-10-CM | POA: Diagnosis not present

## 2022-08-15 DIAGNOSIS — Z6832 Body mass index (BMI) 32.0-32.9, adult: Secondary | ICD-10-CM | POA: Diagnosis not present

## 2022-08-15 DIAGNOSIS — R21 Rash and other nonspecific skin eruption: Secondary | ICD-10-CM | POA: Diagnosis not present

## 2022-08-15 DIAGNOSIS — M79644 Pain in right finger(s): Secondary | ICD-10-CM | POA: Diagnosis not present

## 2022-08-15 DIAGNOSIS — E669 Obesity, unspecified: Secondary | ICD-10-CM | POA: Diagnosis not present

## 2022-08-15 DIAGNOSIS — M1991 Primary osteoarthritis, unspecified site: Secondary | ICD-10-CM | POA: Diagnosis not present

## 2022-08-21 DIAGNOSIS — L918 Other hypertrophic disorders of the skin: Secondary | ICD-10-CM | POA: Diagnosis not present

## 2022-08-21 DIAGNOSIS — L2089 Other atopic dermatitis: Secondary | ICD-10-CM | POA: Diagnosis not present

## 2022-08-21 DIAGNOSIS — L02422 Furuncle of left axilla: Secondary | ICD-10-CM | POA: Diagnosis not present

## 2022-12-12 DIAGNOSIS — J019 Acute sinusitis, unspecified: Secondary | ICD-10-CM | POA: Diagnosis not present

## 2022-12-12 DIAGNOSIS — B9689 Other specified bacterial agents as the cause of diseases classified elsewhere: Secondary | ICD-10-CM | POA: Diagnosis not present

## 2022-12-29 DIAGNOSIS — J019 Acute sinusitis, unspecified: Secondary | ICD-10-CM | POA: Diagnosis not present

## 2023-01-07 ENCOUNTER — Telehealth: Payer: Self-pay | Admitting: Internal Medicine

## 2023-01-07 NOTE — Telephone Encounter (Signed)
I called and spoke with the pt  She is c/o increased cough past 3 wks  She states that she has been seen by her PCP and by an UC out of town and treated with steroids and 2 courses of abx  Still has cough and hoarseness  Acute visit scheduled for tomorrow with ND

## 2023-01-08 ENCOUNTER — Encounter: Payer: Self-pay | Admitting: Internal Medicine

## 2023-01-08 ENCOUNTER — Ambulatory Visit: Payer: 59 | Admitting: Internal Medicine

## 2023-01-08 VITALS — BP 118/64 | HR 80 | Temp 97.6°F | Ht 62.0 in | Wt 180.0 lb

## 2023-01-08 DIAGNOSIS — J387 Other diseases of larynx: Secondary | ICD-10-CM

## 2023-01-08 DIAGNOSIS — J4540 Moderate persistent asthma, uncomplicated: Secondary | ICD-10-CM | POA: Diagnosis not present

## 2023-01-08 DIAGNOSIS — R058 Other specified cough: Secondary | ICD-10-CM

## 2023-01-08 MED ORDER — BENZONATATE 100 MG PO CAPS: 200.0000 mg | ORAL_CAPSULE | Freq: Two times a day (BID) | ORAL | 0 refills | Status: AC | PRN

## 2023-01-08 NOTE — Patient Instructions (Addendum)
Follow up with [name] as scheduled.  I recommend strict vocal rest. Please start doing the exercises given to you by Dr. Delford Field previously. Some of the ones I've given below may help. Warm liquids with honey in them will help soothe your throat and coughing. I am giving you benzonatate capsules to help in the short term with cough suppression. You can take DM as well. Continue flonase. Continue wixela and albuterol as needed. I hope you feel better!  This should improve in the next few weeks before you see him again.   Respiratory Retraining and Laryngeal Control Exercises These techniques focus on keeping the airway and vocal folds open while maintaining a steady stream of air. You're more likely to have a laryngospasm, coughing or throat clearing episode when your vocal folds are closed (when you hold your breath or talk.)  Respiratory Retraining Therapy: Respiratory retraining is a series of techniques for treating persons with Paradoxical Vocal Fold Motion Disorder (PVFMD). PVFMD is characterized by involuntary closing of the vocal folds while attempting to inhale, and sometimes when exhaling. The airway closure prevents air from getting into the lungs, causing breathing difficulty, sometimes associated with wheezing and/or stridor (gasping sound). Other common symptoms associated with PVFMD are chronic cough, frequent throat clearing, globus or foreign body sensation, hoarseness or change in voice quality, and choking. These symptoms can occur constantly of as acute episodes.  The choking sensation, sometimes described as PVFMD episodes or "attacks," can happen at any time. When severe, these episodes become quite panicking, causing repeated visits to ER or even hospitalizations. Often, the condition is misdiagnosed or confused with asthma, especially because it may co-occur in people with asthma. PVFMD is called the "great masquerader" frequently resulting in unnecessary medical intervention.  The intervention found most effective in treating this disorder is speech therapy focusing on respiratory retraining.  An essential aspect of respiratory retraining is reassuring the person that nothing is physically wrong (no present disease or organic/physical obstruction of the airway). Consequently, encouraging the person to learn how to control the breathing will prevent and minimize the severity of possible PVFMD "attacks." It is also necessary to clarify the underlying causes, aggravating circumstances or agents ("triggers"), and predisposing conditions related to PVFMD, such as acid reflux, airborne irritant inductions, emotional stress, and muscular tension.  The keys to therapy for PVFMD include muscle relaxation; retraining of the breath cycle to increase the individual's awareness of relaxed rhythmic cycles, and reduce the effort associated with respiration; and treatment of acid reflux.  The most important aspect of this treatment relies on the fact that the person with PVFMD is actually able to control his or her vocal muscles through regular, rhythmic and relaxed exhalation and passive inhalation. Great emphasis is placed on the abdominal breathing pattern and restoring diaphragm function, which will reduce excessive tension of the muscles in the chest, shoulder elevation and neck contraction.    Respiratory retraining strategies  Goal: Prevent or resolve episodes of shortness of breath due to suspected paradoxical vocal fold motion (PVFM)/vocal cord dysfunction (VCD). These strategies can also help resolve episodes of chronic cough.  Strategies: If you feel like you are about to cough, or are in the middle of a breathing episode, begin using one of the strategies outlined below. Continue the breathing strategy 30 seconds up to several minutes until the symptoms have passed.  1. Smell the Flower/Blow out the Candle: Breathe in slowly through your nose while keeping your lips together  and your jaw  relaxed (don't clench teeth). Steadily blow air out through pursed lips.  2. Gradual Inhalation: Take in three short sniffs through the nose and then exhale through pursed lips or exhale with a "shhh" sound.  3. Straw-shaped Respiration: Pretend that you are breathing in and out of a straw (inhale and exhale through pursed lips). You may also use an actual drinking straw with this strategy.  4. Sharp Inhale on "F" : Use your top teeth to bite your bottom lip like you're going to make the "f" sound. While making the "f" sound, quickly suck in air, like you're gasping. It should sound like you're saying "fuh" while audibly inhaling. This provides a quick burst of air into your airway. After you inhale, slowly exhale on either pursed lips or the open-mouth shape (whichever you prefer).   Practice these when you're not having a coughing episode or laryngospasms so when you do have an episode, it is more automatic to apply them. You might have to continue to do these techniques multiple times (30 seconds to a couple minutes) before they begin to work. Focus on the fact that you are getting adequate air during these episodes and try to feel that sensation of the air moving in and out while using these techniques.    Try to identify triggers of episodes so you can learn to avoid them or use these techniques to work through them if they are impractical to avoid.   Breathing rate/rhythm:  Avoid holding your breath - air should always be moving in or out  Inhalation should occur over a 1-2 second count  Exhalation should occur over a 3-6 second count  Don't take in a huge breath--just breathe in as much air as you need   Homework:  Practice this continuous breathing for 1-2 minutes, 4-5 times a day until you can do them from memory  The strategies have to be completed during episodes to begin decreasing the frequency and length of time the episodes last  Quick review of low  abdominal breathing:  The belly moves OUT as you inhale (breathe in)  The belly moves IN as you exhale/make a sound (breathe out)  The shoulders stay low, relaxed, and still     Strategies to reduce coughing and clearing   Sip liquid or perform a hard swallow instead of coughing or clearing your throat  Will help decrease mucus production and, over time, reduce the urge to cough or clear   Can gently "tap" vocal folds together to remove mucus or exhale on a short "hhh," followed by a swallow   Increase water consumption (think "pee pale") and use caffeine in moderation  Caffeinated drinks dry you out, make you more thirsty, and thicken saliva  Problem with nasal drainage? If on nasal sprays to thin or reduce post-nasal drip or improve nasal congestion:  Aim the tip of the nasal spray towards the outer corner of each eye so the medication goes where it's needed (into the sinuses), sniffing in gently as you spray with your head slightly tilted down  Don't give up too soon--some over-the-counter nasal sprays have to be used continuously (~12-14 days) before they start working and you notice a change (Flonase is one such example)   Try an over the counter saline sinus wash (not just the spray). [facility] Saline Sinus wash is one brand    Dry mouth? Can try cough drops, candy, &/or gum to increase production of saliva:   Only use ones that are  sugar-free--ones with sugar make your saliva thicker and mouth feel more dry  Try honey/lemon or other flavors o Avoid ones with menthol--it can dry you out    Ask your pharmacist if any of your medications can cause dry mouth as a side effect  If so, ask the referring physician if there are alternative medications that might be tried   To keep your voice clear  (especially if talking makes you want to clear or cough)  Breath Support with Speaking: Breathe in and then immediately use the air to speak. Keep the air moving through the  last word of the sentence so your voice doesn't become "gravelly." Take in only as much air as you need to say the sentence. You don't need to take in a big breath--just enough for the sentence. If you aren't breathing in often enough, or are speaking too long on one breath, talking can feel uncomfortable or effortful. In general, you should be taking in a breath during any pause in conversation (wherever punctuation points would be) or as needed as to not drop into glottal fry.  Respiratory Retraining and Laryngeal Control Exercises Try these breathing techniques if you feel you're about to cough, to try to help work through the urge. You may need to continue the breathing cycle for ~15-30 seconds.  Pursed Lip Breathing Take a short inhalation in through your nose (mouth if you find it more helpful) between 1-2 counts long. Immediately exhale through pursed lips like you're gently flickering the flame of a candle for about 5-6 counts. Repeat this with only a short break of about 1 count between each cycle. If you find you are getting lightheaded, shorten your inhalation and lengthen your exhalation.   Straw Breathing Breathe in and out through pursed lips, as if through a straw, or use a straw (opening has to be large enough to easily get enough air in and out.   Irritable Larynx Syndrome (ILS) Irritable Larynx Syndrome (ILS) refers to a group of disorders that include chronic throat clearing & chronic coughing. It involves laryngeal hypersensitivity, which is when the throat overreacts to protect the airway from a common or harmless irritant. Because this irritant has caused such a repetitive, behavioral action of coughing, throat clearing, or a laryngospasm, this has become a natural reaction in response to something that the body has deemed dangerous. Common irritants include: environmental irritants (e.g., strong odors, dust, smoke or chemical exposure), physiological irritants (refluxed material,  mucous) and triggers such as weather changes, physical exertion, stress, or voice use.  Over-the-counter products for dry mouth:  Biotene Products 1. Oral Rinse 2. Lozenges 3. Oral Gel 4. Moisturizing Spray  Where to buy:  Pharmacies such as: [facility], [facility], [facility]  Stores such as: [employer]  Online: [employer] 1. Stick-On Melts ? Stick to gum and inside of cheek discreetly during the day to increase saliva production; stick two on at night to prevent dry mouth while sleeping and when you wake up.   Where to Buy:  Pharmacies such as: [facility], [facility], [facility]  Stores such as: [employer] [facility] [employer]  Online: [employer]

## 2023-01-08 NOTE — Progress Notes (Signed)
Cassidy Walton    [id number]    January 05, 1961  Primary Care Physician:Webb, Okey Regal, MD Date of Appointment: 01/08/2023 Established Patient Visit  Chief complaint:   Chief Complaint  Patient presents with   Acute Visit    started 6/23 with a sinus infection and cough has not gone away after a round of antibiotic's and prednisone.  Cough is better today      HPI: Cassidy Walton is a 62 y.o. with a history of asthma and allergic rhinitis. Patient of [name].   Interval Updates: Here for acute visit. Was treated with steroids and abx a month ago for asthma exacerbation.  Had a sinus headache to instigate this so she got on the chlorpheniramine and flonase.  A few days later she had worsening symptoms and saw PCP who gave her doxycycline and prednisone.  Had improvement in her symptoms but has had persistent cough, snoring, sinus congestion.  Went to urgent care and got azithromycin and was told to stop taking chlorpheniramine.   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 So she has been standing outside in the heat and talking a lot. This is how she makes her living.    She does have irritable larynx syndrome and sees Dr. 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  This has helped her a lot in the past.   She started taking mucinex DM twice daily.   Asthma regimen - wixela 250, prn albuterol  I have reviewed the patient's family social and past medical history and updated as appropriate.   Past Medical History:  Diagnosis Date   Abnormal pap    CIN III   Environmental allergies 04/19/15   Heart murmur    History of polymyalgia rheumatica    HSV-1 infection    Recurrent laryngeal neuropathy 2017   Spastic colon    hx of Spastic colon    Past Surgical History:  Procedure Laterality Date   CHOLECYSTECTOMY     cold knfe cone     CIN III.    Family History  Problem Relation Age of Onset   Heart  failure Father    Osteoarthritis Mother    Hypertension Mother    Obesity Sister    Osteoarthritis Sister    Osteoarthritis Brother    Stroke Maternal Grandmother    Heart disease Maternal Grandmother    Hypertension Maternal Grandmother    Stroke Paternal Grandmother    Heart disease Paternal Grandmother    Allergies Other        "everyone"   Breast cancer Neg Hx     Social History   Occupational History   Occupation: Higher education careers adviser weaver  Tobacco Use   Smoking status: Never   Smokeless tobacco: Never  Vaping Use   Vaping status: Never Used  Substance and Sexual Activity   Alcohol use: Yes    Alcohol/week: 4.0 - 5.0 standard drinks of alcohol    Types: 4 - 5 Cans of beer per week   Drug use: No   Sexual activity: Yes    Partners: Male    Birth control/protection: Post-menopausal     Physical Exam: Blood pressure 118/64, pulse 80, temperature 97.6 F (36.4 C), temperature source Temporal, height 5\' 2"  (1.575 m), weight 180 lb (81.6 kg), last menstrual period 03/24/2011, SpO2 97%.  Gen:      No acute distress ENT:  +cobblestoning in  oropharynx, no nasal polyps, mucus membranes moist Lungs:    No increased respiratory effort, symmetric chest wall excursion, clear to auscultation bilaterally, no wheezes or crackles CV:         Regular rate and rhythm; no murmurs, rubs, or gallops.  No pedal edema   Data Reviewed: Imaging: I have personally reviewed the CT Chest high resolution in 2020, no ILD mild peribronchovasular nodules c/w infection.   PFTs:   Labs:  Immunization status: Immunization History  Administered Date(s) Administered   COVID-19, mRNA, vaccine(Comirnaty)12 years and older 05/13/2022   PFIZER(Purple Top)SARS-COV-2 Vaccination 08/30/2019, 09/19/2019, 02/08/2020   Pfizer Covid-19 Vaccine Bivalent Booster 68yrs & up 03/01/2021   Tdap 11/26/2018    External Records Personally Reviewed: pulmonary  Assessment:  Post viral cough syndrome Moderate  persistent asthma Seasonal allergic rhinits  Plan/Recommendations: Follow up with [name] as scheduled.  I recommend strict vocal rest. Please start doing the exercises given to you by Dr. Delford Field previously. Some of the ones I've given below may help. Warm liquids with honey in them will help soothe your throat and coughing. I am giving you benzonatate capsules to help in the short term with cough suppression. You can take DM as well. Continue flonase. Continue wixela and albuterol as needed. I hope you feel better!  This should improve in the next few weeks before you see him again.    Return to Care: As scheduled with [name].    Durel Salts, MD Pulmonary and Critical Care Medicine [facility] [phone]

## 2023-01-24 NOTE — Patient Instructions (Addendum)
ICD-10-CM   1. Cough variant asthma  J45.991     2. Chronic cough  R05.3     3. Irritable larynx syndrome  J38.7     4. Eosinophilia, unspecified type  D72.10     5. Exacerbation of asthma, unspecified asthma severity, unspecified whether persistent  J45.901     6. History of chronic eczema  Z87.2       You have new onset of very mild bronchiectasis in August 2021 CT scan done at [facility] but Blood IgE is normal. Multiple lung nodules noted in August 2021 CT scan at [facility]: Reported as benign without any further follow-up needed  On 01/25/2023 - having asthma flare up slow to resolve along with chronic cough  Plan - - check blood cbc with diff and blood IgE 01/25/2023   - Take prednisone 40 mg daily x 2 days, then 20mg  daily x 2 days, then 10mg  daily x 2 days, then 5mg  daily x 2 days and stop  - - Continue Wixela scheduled  - consider embracing idea of duipxent for eczema as recommended by your dermatologist   - will make more sense if you blood eosinophil 01/25/2023\ is elevated  - flu shot in fall   Follow-up - 6months or sooner if needed' RSI cough score at followup
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
## 2023-01-25 ENCOUNTER — Ambulatory Visit: Payer: 59 | Admitting: Internal Medicine

## 2023-01-25 ENCOUNTER — Encounter: Payer: Self-pay | Admitting: Internal Medicine

## 2023-01-25 VITALS — BP 102/60 | HR 66 | Ht 62.0 in | Wt 179.8 lb

## 2023-01-25 DIAGNOSIS — J387 Other diseases of larynx: Secondary | ICD-10-CM

## 2023-01-25 DIAGNOSIS — J45901 Unspecified asthma with (acute) exacerbation: Secondary | ICD-10-CM

## 2023-01-25 DIAGNOSIS — D721 Eosinophilia, unspecified: Secondary | ICD-10-CM | POA: Diagnosis not present

## 2023-01-25 DIAGNOSIS — J45991 Cough variant asthma: Secondary | ICD-10-CM

## 2023-01-25 DIAGNOSIS — Z872 Personal history of diseases of the skin and subcutaneous tissue: Secondary | ICD-10-CM

## 2023-01-25 DIAGNOSIS — R053 Chronic cough: Secondary | ICD-10-CM | POA: Diagnosis not present

## 2023-01-25 LAB — CBC WITH DIFFERENTIAL/PLATELET
Basophils Absolute: 0.1 10*3/uL (ref 0.0–0.1)
Basophils Relative: 1.3 % (ref 0.0–3.0)
Eosinophils Absolute: 0.6 10*3/uL (ref 0.0–0.7)
Eosinophils Relative: 7.8 % — ABNORMAL HIGH (ref 0.0–5.0)
HCT: 43.4 % (ref 36.0–46.0)
Hemoglobin: 14.3 g/dL (ref 12.0–15.0)
Lymphocytes Relative: 18.3 % (ref 12.0–46.0)
Lymphs Abs: 1.3 10*3/uL (ref 0.7–4.0)
MCHC: 32.9 g/dL (ref 30.0–36.0)
MCV: 92.4 fl (ref 78.0–100.0)
Monocytes Absolute: 0.5 10*3/uL (ref 0.1–1.0)
Monocytes Relative: 7 % (ref 3.0–12.0)
Neutro Abs: 4.7 10*3/uL (ref 1.4–7.7)
Neutrophils Relative %: 65.6 % (ref 43.0–77.0)
Platelets: 269 10*3/uL (ref 150.0–400.0)
RBC: 4.7 Mil/uL (ref 3.87–5.11)
RDW: 15.2 % (ref 11.5–15.5)
WBC: 7.1 10*3/uL (ref 4.0–10.5)

## 2023-01-25 MED ORDER — FLUTICASONE-SALMETEROL 250-50 MCG/ACT IN AEPB: 1.0000 | INHALATION_SPRAY | Freq: Two times a day (BID) | RESPIRATORY_TRACT | 11 refills | Status: DC

## 2023-01-25 MED ORDER — ALBUTEROL SULFATE HFA 108 (90 BASE) MCG/ACT IN AERS: 1.0000 | INHALATION_SPRAY | Freq: Four times a day (QID) | RESPIRATORY_TRACT | 11 refills | Status: AC | PRN

## 2023-01-25 MED ORDER — PREDNISONE 10 MG PO TABS: ORAL_TABLET | ORAL | 0 refills | Status: AC

## 2023-01-25 NOTE — Addendum Note (Signed)
Addended by: Hedda Slade on: 01/25/2023 11:03 AM   Modules accepted: Orders

## 2023-02-01 ENCOUNTER — Ambulatory Visit (HOSPITAL_COMMUNITY)
Admission: RE | Admit: 2023-02-01 | Discharge: 2023-02-01 | Disposition: A | Discharge status: Home / Self Care | Payer: 59 | Source: Ambulatory Visit | Attending: Cardiology | Admitting: Cardiology

## 2023-02-01 ENCOUNTER — Telehealth: Payer: Self-pay | Admitting: Internal Medicine

## 2023-02-01 ENCOUNTER — Other Ambulatory Visit (HOSPITAL_COMMUNITY): Payer: Self-pay | Admitting: Family Medicine

## 2023-02-01 DIAGNOSIS — M7989 Other specified soft tissue disorders: Secondary | ICD-10-CM

## 2023-02-01 NOTE — Telephone Encounter (Signed)
  Both Blood IgE and eosinophils high   Plan  - she can benefit from biologic xolair or fasenra/nucala/dupixent  - -if she is interesed in these to help her symptoms she can set  a meeting with Devki in Rx team  Recent Results (from the past 2160 hour(s))  IgE     Status: Abnormal   Collection Time: 01/25/23 11:05 AM  Result Value Ref Range   IgE (Immunoglobulin E), Serum 850 (H) <OR=114 kU/L  CBC w/Diff     Status: Abnormal   Collection Time: 01/25/23 11:05 AM  Result Value Ref Range   WBC 7.1 4.0 - 10.5 K/uL   RBC 4.70 3.87 - 5.11 Mil/uL   Hemoglobin 14.3 12.0 - 15.0 g/dL   HCT 29.5 62.1 - 30.8 %   MCV 92.4 78.0 - 100.0 fl   MCHC 32.9 30.0 - 36.0 g/dL   RDW 65.7 84.6 - 96.2 %   Platelets 269.0 150.0 - 400.0 K/uL   Neutrophils Relative % 65.6 43.0 - 77.0 %   Lymphocytes Relative 18.3 12.0 - 46.0 %   Monocytes Relative 7.0 3.0 - 12.0 %   Eosinophils Relative 7.8 (H) 0.0 - 5.0 %   Basophils Relative 1.3 0.0 - 3.0 %   Neutro Abs 4.7 1.4 - 7.7 K/uL   Lymphs Abs 1.3 0.7 - 4.0 K/uL   Monocytes Absolute 0.5 0.1 - 1.0 K/uL   Eosinophils Absolute 0.6 0.0 - 0.7 K/uL   Basophils Absolute 0.1 0.0 - 0.1 K/uL

## 2023-02-06 NOTE — Telephone Encounter (Signed)
Pt called in for results  

## 2023-02-12 DIAGNOSIS — L03116 Cellulitis of left lower limb: Secondary | ICD-10-CM | POA: Diagnosis not present

## 2023-02-12 DIAGNOSIS — J45909 Unspecified asthma, uncomplicated: Secondary | ICD-10-CM | POA: Diagnosis not present

## 2023-02-14 NOTE — Telephone Encounter (Signed)
Patient is scheduled for phone call on 9/9 at 1:20.

## 2023-02-14 NOTE — Telephone Encounter (Signed)
Informed patient of results.  She is interested in speaking with the pharmacy staff regarding her options.  She is unable to pay a specialty co pay at this time if this could be handled over the phone.  Recently diagnosed with cellulitis.

## 2023-02-25 ENCOUNTER — Telehealth: Payer: Self-pay | Admitting: Pharmacist

## 2023-02-25 ENCOUNTER — Ambulatory Visit: Payer: 59 | Admitting: Pharmacist

## 2023-02-25 NOTE — Telephone Encounter (Signed)
Please start Dupixent BIV  Dose: 600mg  SQ at Day 0 then 300mg  SQ every 14 days thereafter  DX: moderate persistent asthma + atopic dermatitis  Please submit my visit note, derm note, and Dr. Jane Canary note for PA  Chesley Mires, PharmD, MPH, BCPS, CPP Clinical Pharmacist (Rheumatology and Pulmonology)

## 2023-02-25 NOTE — Telephone Encounter (Signed)
Initiated a Prior Authorization request to [facility] for DUPIXENT via CoverMyMeds. Will submit once Dermatology chart notes have been received and Cassidy Walton's note is completed and both are attached to the request.  Key: [id number]

## 2023-02-25 NOTE — Progress Notes (Signed)
HPI Patient called today by [facility] Pulmonary pharmacy team to discuss treatment options for eosinophilic asthma. Her husband, Mac, also on phone today Patient's DOB confirmed. Past medical history includes asthma, atopic dermatitis, HTN  She developed cellulitis in legs from scratching area that was severely affected atopic dermatitis. Derm believes this led to patient developing infection.   Respiratory Medications Current: Wixela 250-29mcg (1 puff twice daily) Tried in past: Advair, Symbicort, Dulera Patient reports no known adherence challenges  OBJECTIVE Allergies  Allergen Reactions   Bee Venom    Codeine Sulfate     "makes me cry"   Penicillins Hives    Fever, hallucation per pt    Outpatient Encounter Medications as of 02/25/2023  Medication Sig   albuterol (VENTOLIN HFA) 108 (90 Base) MCG/ACT inhaler Inhale 1-2 puffs into the lungs every 6 (six) hours as needed for wheezing or shortness of breath.   b complex vitamins tablet Take 1 tablet by mouth every other day.    benzonatate (TESSALON PERLES) 100 MG capsule Take 2 capsules (200 mg total) by mouth 2 (two) times daily as needed for cough. (Patient not taking: Reported on 01/25/2023)   Black Pepper-Turmeric (TURMERIC COMPLEX/BLACK PEPPER) 3-500 MG CAPS 1 capsule Orally once a day   Ergocalciferol (VITAMIN D2) 2000 UNITS TABS Take 1 tablet by mouth daily.   Estradiol (YUVAFEM) 10 MCG TABS vaginal tablet PLACE ONE TABLET VAGINALLY TWICE A WEEK   fluticasone (FLONASE) 50 MCG/ACT nasal spray 2 sprays in each nostril Nasally twice a day   fluticasone-salmeterol (WIXELA INHUB) 250-50 MCG/ACT AEPB Inhale 1 puff into the lungs 2 (two) times daily. in the morning and at bedtime.   hydroxychloroquine (PLAQUENIL) 200 MG tablet Take 200 mg by mouth 2 (two) times daily.   meloxicam (MOBIC) 7.5 MG tablet Take 7.5 mg by mouth daily as needed for pain.   Misc Natural Products (TURMERIC CURCUMIN) CAPS Take 1 capsule by mouth daily.    Multiple Vitamin (MULTIVITAMIN) capsule Take 1 capsule by mouth daily.   niacin 500 MG tablet Take 500 mg by mouth at bedtime.   traMADol (ULTRAM) 50 MG tablet 1 tablet as needed   valACYclovir (VALTREX) 1000 MG tablet Take 2 g by mouth as needed.   No facility-administered encounter medications on file as of 02/25/2023.     Immunization History  Administered Date(s) Administered   COVID-19, mRNA, vaccine(Comirnaty)12 years and older 05/13/2022   PFIZER(Purple Top)SARS-COV-2 Vaccination 08/30/2019, 09/19/2019, 02/08/2020   Pfizer Covid-19 Vaccine Bivalent Booster 62yrs & up 03/01/2021   Tdap 11/26/2018     PFTs     No data to display           Eosinophils Most recent absolute blood eosinophil count was 600 cells/microL taken on 01/25/2023.   IgE: 850 on 01/25/2023  Assessment   Biologics education Omalizumab (Xolair) - patient does have elevvated IgE and could possibly qualify for this for allergic asthma. Not approved for atopic dermatitis Benralizumab (Fasenra) and Mepolizumab (Nucala) - patient has eosinophilic asthma. These are not FDA-approved for atopic dermatitis Dupilumab (Dupixent) MOA: a human monoclonal IgG4 antibody that inhibits interleukin-4 and interleukin-13 cytokine-induced responses, including release of proinflammatory cytokines, chemokines, and IgE Response to therapy: takes 3-4 months to adequately gauge response.  Side effects: injection site reaction (6-18%), antibody development (5-16%), ophthalmic conjunctivitis (2-16%), transient blood eosinophilia (1-2%) Dosing: 600 mg followed by 300 mg subQ every other week Administration and storage: will review in detail at new start visit  PLAN - Given  patient's history of eosinophilic asthma AND poorly controlled atopic dermatitis, Dupixent is the preferred asthma biologic. She states her asthma is worse during this time of (specifically fall and springtime). Has noticed worsening of symptoms but overall her eczema  is definitely poorly controlled. She recently developed cellulitis from wound in lower extremity that is presumed to have originated from atopic dermatitis irritation/scratching - Patient will be enrolling into Medicare in February 2025 once she turns 63. 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 We reviewed coverage options but she will have opportunity to at least take Dupixent for 3-4 months to assess efficacy  All questions encouraged and answered.  Instructed patient to reach out with any further questions or concerns.  Thank you for allowing pharmacy to participate in this patient's care.  This appointment required 25 minutes of patient care (this includes precharting, chart review, review of results, face-to-face care, etc.).

## 2023-02-27 NOTE — Telephone Encounter (Signed)
Dermatology and clinical notes have been received and uploaded, PA has been submitted. Will await determination.

## 2023-03-01 ENCOUNTER — Other Ambulatory Visit (HOSPITAL_COMMUNITY): Payer: Self-pay

## 2023-03-01 NOTE — Telephone Encounter (Signed)
Received notification from [facility] regarding a prior authorization for DUPIXENT. Authorization has been APPROVED from 02/27/23 to 08/27/23. Approval letter sent to scan center.  Unable to run test claim because patient must fill through [facility]: [phone]  Authorization # [id number]  Patient will need to be enrolled into Dupixent copay card  Chesley Mires, PharmD, MPH, BCPS, CPP Clinical Pharmacist (Rheumatology and Pulmonology)

## 2023-03-08 NOTE — Telephone Encounter (Signed)
Patient has been enrolled into the [employer] copay card program using the automated phone service. Copy of copay card will be sent to scan center for retention.  RxBIN: [id number] RxPCN: [facility] RxGRP: [id number] ID: [id number]

## 2023-03-08 NOTE — Telephone Encounter (Signed)
Patient scheduled for Dupixent new start on 03/11/23  Chesley Mires, PharmD, MPH, BCPS, CPP Clinical Pharmacist (Rheumatology and Pulmonology)

## 2023-03-11 ENCOUNTER — Ambulatory Visit: Payer: 59 | Admitting: Pharmacist

## 2023-03-11 DIAGNOSIS — D721 Eosinophilia, unspecified: Secondary | ICD-10-CM

## 2023-03-11 DIAGNOSIS — J4540 Moderate persistent asthma, uncomplicated: Secondary | ICD-10-CM

## 2023-03-11 DIAGNOSIS — Z7189 Other specified counseling: Secondary | ICD-10-CM

## 2023-03-11 DIAGNOSIS — L209 Atopic dermatitis, unspecified: Secondary | ICD-10-CM

## 2023-03-11 MED ORDER — DUPIXENT 300 MG/2ML ~~LOC~~ SOAJ
300.0000 mg | SUBCUTANEOUS | 0 refills | Status: DC
Start: 2023-03-11 — End: 2023-05-22

## 2023-03-11 NOTE — Progress Notes (Signed)
HPI Patient presents today to [facility] to see pharmacy team for Dupixent new start. She is accompanied by her husbnad Mac today who is willing to help administer Dupixent if needed.  Patient has moderate persistent asthma with comorbid atopic dermatitis. Asthma relatively controlled as compared to atopic dermatitis. Attests that itchiness is predominant symptom that often causes awakenings at night  No changes to conditions, allergies. Has started Flonase for about 3 weeks to help with allergies - taking 1 spray in each nostril once daily  Respiratory Medications Current regimen: Wixela 250-35mcg (1 puff twice daily) Patient reports no known adherence challenges  OBJECTIVE Allergies  Allergen Reactions   Bee Venom    Codeine Sulfate     "makes me cry"   Penicillins Hives    Fever, hallucation per pt    Outpatient Encounter Medications as of 03/11/2023  Medication Sig   Dupilumab (DUPIXENT) 300 MG/2ML SOPN Inject 300 mg into the skin every 14 (fourteen) days. **loading dose completed in clinic on 03/11/23**   albuterol (VENTOLIN HFA) 108 (90 Base) MCG/ACT inhaler Inhale 1-2 puffs into the lungs every 6 (six) hours as needed for wheezing or shortness of breath.   b complex vitamins tablet Take 1 tablet by mouth every other day.    benzonatate (TESSALON PERLES) 100 MG capsule Take 2 capsules (200 mg total) by mouth 2 (two) times daily as needed for cough. (Patient not taking: Reported on 01/25/2023)   Black Pepper-Turmeric (TURMERIC COMPLEX/BLACK PEPPER) 3-500 MG CAPS 1 capsule Orally once a day   Ergocalciferol (VITAMIN D2) 2000 UNITS TABS Take 1 tablet by mouth daily.   Estradiol (YUVAFEM) 10 MCG TABS vaginal tablet PLACE ONE TABLET VAGINALLY TWICE A WEEK   fluticasone (FLONASE) 50 MCG/ACT nasal spray 2 sprays in each nostril Nasally twice a day   fluticasone-salmeterol (WIXELA INHUB) 250-50 MCG/ACT AEPB Inhale 1 puff into the lungs 2 (two) times daily. in the morning and at  bedtime.   hydroxychloroquine (PLAQUENIL) 200 MG tablet Take 200 mg by mouth 2 (two) times daily.   meloxicam (MOBIC) 7.5 MG tablet Take 7.5 mg by mouth daily as needed for pain.   Misc Natural Products (TURMERIC CURCUMIN) CAPS Take 1 capsule by mouth daily.   Multiple Vitamin (MULTIVITAMIN) capsule Take 1 capsule by mouth daily.   niacin 500 MG tablet Take 500 mg by mouth at bedtime.   traMADol (ULTRAM) 50 MG tablet 1 tablet as needed   valACYclovir (VALTREX) 1000 MG tablet Take 2 g by mouth as needed.   No facility-administered encounter medications on file as of 03/11/2023.    Immunization History  Administered Date(s) Administered   PFIZER(Purple Top)SARS-COV-2 Vaccination 08/30/2019, 09/19/2019, 02/08/2020   Pfizer Covid-19 Vaccine Bivalent Booster 20yrs & up 03/01/2021   Pfizer(Comirnaty)Fall Seasonal Vaccine 12 years and older 05/13/2022   Tdap 11/26/2018    PFTs     No data to display          Eosinophils Most recent absolute blood eosinophil count was 600 cells/microL taken on 01/25/2023.   IgE: 850 on 01/25/23  Assessment   Biologics training for dupilumab (Dupixent)  Goals of therapy: Mechanism: human monoclonal IgG4 antibody that inhibits interleukin-4 and interleukin-13 cytokine-induced responses, including release of proinflammatory cytokines, chemokines, and IgE Reviewed that Dupixent is add-on medication and patient must continue maintenance inhaler regimen. Response to therapy: may take 4 months to determine efficacy. Discussed that patients generally feel improvement sooner than 4 months.  Side effects: injection site reaction (6-18%), antibody  development (5-16%), ophthalmic conjunctivitis (2-16%), transient blood eosinophilia (1-2%)  Dose: 600mg  at Week 0 (administered today in clinic) followed by 300mg  every 14 days thereafter  Administration/Storage:  Reviewed administration sites of thigh or abdomen (at least 2-3 inches away from abdomen). Reviewed the  upper arm is only appropriate if caregiver is administering injection  Do not shake pen/syringe as this could lead to product foaming or precipitation. Do not use if solution is discolored or contains particulate matter or if window on prefilled pen is yellow (indicates pen has been used).  Reviewed storage of medication in refrigerator. Reviewed that Dupixent can be stored at room temperature in unopened carton for up to 14 days.  Access: Approval of Dupixent through: insurance Patient enrolled into copay card program to help with copay assistance.  Patient self-administered Dupixent 300mg /38ml x 2 (total dose 600mg ) in right lower abdomen (by pt) and left lower abdomen (by provider) using sample Dupixent 300mg /49mL autoinjector pen NDC: [phone] Lot: [id number] Expiration: 10/15/2024  Patient monitored for 30 minutes for adverse reaction.  Patient tolerated with some discomfort. She has degree of needlephobia  Injection site noted. Patient denies itchiness and irritation at injection., No swelling or redness noted., and Reviewed injection site reaction management with patient verbally and printed information for review in AVS  Medication Reconciliation  A drug regimen assessment was performed, including review of allergies, interactions, disease-state management, dosing and immunization history. Medications were reviewed with the patient, including name, instructions, indication, goals of therapy, potential side effects, importance of adherence, and safe use.  Drug interaction(s): none noted  PLAN Continue Dupixent 300mg  every 14 days.  Next dose is due 03/25/2023 and every 14 days thereafter. [facility]: [id number].  Patient provided with pharmacy phone number and advised to call later this week to schedule shipment to home. Patient provided with copay card information to provide to pharmacy if quoted copay exceeds $5 per month. She is willing to continue to try to  self-administer at home with assistance of husband prn. She'll let our clinic know if any issues Continue maintenance asthma regimen of: Wixela 250-60mcg (1 puff twice daily)  All questions encouraged and answered.  Instructed patient to reach out with any further questions or concerns.  Thank you for allowing pharmacy to participate in this patient's care.  This appointment required 45 minutes of patient care (this includes precharting, chart review, review of results, face-to-face care, etc.).   Chesley Mires, PharmD, MPH, BCPS, CPP Clinical Pharmacist (Rheumatology and Pulmonology)

## 2023-03-11 NOTE — Patient Instructions (Signed)
Your next DUPIXENT dose is due on 03/25/23, 04/08/23, and every 14 days thereafter  CONTINUE Wixela Inhub (1 puff twice daily). Can increse Flonase to 2 sprays instilled in each nostril twice daily  Your prescription will be shipped from [facility]. Their phone number is [phone]  Please call to schedule shipment and confirm address. They will mail your medication to your home.  Your copay should be affordable. If you call the pharmacy and it is not affordable, please double-check that they are billing through your copay card as secondary coverage. That copay card information is: RxBIN: [id number] RxPCN: [employer] RxGRP: [id number] ID: [id number]  You will need to be seen by your provider in 3 to 4 months to assess how DUPIXENT is working for you. Please ensure you have a follow-up appointment scheduled at the end of December 2024 or January 2025. Call our clinic if you need to make this appointment.  How to manage an injection site reaction: Remember the 5 C's: COUNTER - leave on the counter at least 30 minutes but up to overnight to bring medication to room temperature. This may help prevent stinging COLD - place something cold (like an ice gel pack or cold water bottle) on the injection site just before cleansing with alcohol. This may help reduce pain CLARITIN - use Claritin (generic name is loratadine) for the first two weeks of treatment or the day of, the day before, and the day after injecting. This will help to minimize injection site reactions CORTISONE CREAM - apply if injection site is irritated and itching CALL ME - if injection site reaction is bigger than the size of your fist, looks infected, blisters, or if you develop hives

## 2023-04-01 ENCOUNTER — Telehealth: Payer: Self-pay | Admitting: Pharmacist

## 2023-04-01 NOTE — Telephone Encounter (Signed)
Received VM from patient about Dupixent. She had some leakage of Dupixent when she injected in thigh. She states injection in thigh was slightly more painful. Recommend she alternate left-right in abdomen if injection at this location is more comfortable.   She will have her husband help with next dose.  Chesley Mires, PharmD, MPH, BCPS, CPP Clinical Pharmacist (Rheumatology and Pulmonology)

## 2023-04-10 DIAGNOSIS — M25562 Pain in left knee: Secondary | ICD-10-CM | POA: Diagnosis not present

## 2023-04-10 DIAGNOSIS — M1991 Primary osteoarthritis, unspecified site: Secondary | ICD-10-CM | POA: Diagnosis not present

## 2023-04-10 DIAGNOSIS — E559 Vitamin D deficiency, unspecified: Secondary | ICD-10-CM | POA: Diagnosis not present

## 2023-04-10 DIAGNOSIS — Z6831 Body mass index (BMI) 31.0-31.9, adult: Secondary | ICD-10-CM | POA: Diagnosis not present

## 2023-04-10 DIAGNOSIS — E669 Obesity, unspecified: Secondary | ICD-10-CM | POA: Diagnosis not present

## 2023-04-10 DIAGNOSIS — M542 Cervicalgia: Secondary | ICD-10-CM | POA: Diagnosis not present

## 2023-04-10 DIAGNOSIS — M79644 Pain in right finger(s): Secondary | ICD-10-CM | POA: Diagnosis not present

## 2023-04-10 DIAGNOSIS — M064 Inflammatory polyarthropathy: Secondary | ICD-10-CM | POA: Diagnosis not present

## 2023-04-16 DIAGNOSIS — J310 Chronic rhinitis: Secondary | ICD-10-CM | POA: Diagnosis not present

## 2023-04-16 DIAGNOSIS — J45909 Unspecified asthma, uncomplicated: Secondary | ICD-10-CM | POA: Diagnosis not present

## 2023-04-16 DIAGNOSIS — Z0000 Encounter for general adult medical examination without abnormal findings: Secondary | ICD-10-CM | POA: Diagnosis not present

## 2023-04-16 DIAGNOSIS — E559 Vitamin D deficiency, unspecified: Secondary | ICD-10-CM | POA: Diagnosis not present

## 2023-04-16 DIAGNOSIS — E162 Hypoglycemia, unspecified: Secondary | ICD-10-CM | POA: Diagnosis not present

## 2023-04-16 DIAGNOSIS — E782 Mixed hyperlipidemia: Secondary | ICD-10-CM | POA: Diagnosis not present

## 2023-04-16 DIAGNOSIS — M353 Polymyalgia rheumatica: Secondary | ICD-10-CM | POA: Diagnosis not present

## 2023-05-21 ENCOUNTER — Other Ambulatory Visit: Payer: Self-pay | Admitting: Internal Medicine

## 2023-05-21 DIAGNOSIS — D721 Eosinophilia, unspecified: Secondary | ICD-10-CM

## 2023-05-21 DIAGNOSIS — L209 Atopic dermatitis, unspecified: Secondary | ICD-10-CM

## 2023-05-21 DIAGNOSIS — J4540 Moderate persistent asthma, uncomplicated: Secondary | ICD-10-CM

## 2023-06-03 NOTE — Patient Instructions (Addendum)
ICD-10-CM   1. Moderate persistent asthma, uncomplicated  J45.40     2. Cough variant asthma  J45.991     3. History of chronic eczema  Z87.2     4. Eosinophilia, unspecified type  D72.10     5. Elevated IgE level  R76.8      Significant improvement in symptoms including eczema after starting Dupixent September 2024.  I am glad for you.  Plan - - -continue Dupixent as scheduled -Continue Flonase, Advair/Wixela -Continue albuterol as needed. -  flu shot deferred today per your request but please have it on your own  Follow-up - 6months or sooner if needed' RSI cough score at followup  -Will check CBC with differential at follow-up  =-Cough score at follow-up
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
## 2023-06-04 ENCOUNTER — Encounter: Payer: Self-pay | Admitting: Internal Medicine

## 2023-06-04 ENCOUNTER — Ambulatory Visit (INDEPENDENT_AMBULATORY_CARE_PROVIDER_SITE_OTHER): Payer: 59 | Admitting: Internal Medicine

## 2023-06-04 VITALS — BP 122/70 | HR 78 | Ht 62.0 in | Wt 184.2 lb

## 2023-06-04 DIAGNOSIS — D721 Eosinophilia, unspecified: Secondary | ICD-10-CM

## 2023-06-04 DIAGNOSIS — J45991 Cough variant asthma: Secondary | ICD-10-CM | POA: Diagnosis not present

## 2023-06-04 DIAGNOSIS — Z872 Personal history of diseases of the skin and subcutaneous tissue: Secondary | ICD-10-CM

## 2023-06-04 DIAGNOSIS — J4540 Moderate persistent asthma, uncomplicated: Secondary | ICD-10-CM

## 2023-06-04 DIAGNOSIS — R768 Other specified abnormal immunological findings in serum: Secondary | ICD-10-CM

## 2023-06-25 DIAGNOSIS — J40 Bronchitis, not specified as acute or chronic: Secondary | ICD-10-CM | POA: Diagnosis not present

## 2023-06-25 DIAGNOSIS — J3489 Other specified disorders of nose and nasal sinuses: Secondary | ICD-10-CM | POA: Diagnosis not present

## 2023-08-01 ENCOUNTER — Telehealth: Payer: Self-pay | Admitting: Pharmacist

## 2023-08-01 DIAGNOSIS — D721 Eosinophilia, unspecified: Secondary | ICD-10-CM

## 2023-08-01 DIAGNOSIS — J4540 Moderate persistent asthma, uncomplicated: Secondary | ICD-10-CM

## 2023-08-01 DIAGNOSIS — L209 Atopic dermatitis, unspecified: Secondary | ICD-10-CM

## 2023-08-01 NOTE — Telephone Encounter (Addendum)
Received VM from patient stating that she has changed to [facility] Medicare and eneds [place]. She used her last dose on 07/30/2023  Submitted an URGENT Prior Authorization request to [facility] for DUPIXENT via CoverMyMeds. Will update once we receive a response.  Key: Lyndel Pleasure, PharmD, MPH, BCPS, CPP Clinical Pharmacist (Rheumatology and Pulmonology)

## 2023-08-05 NOTE — Telephone Encounter (Signed)
 Patient is checking on message for Dupixent. Would like sample of Dupixent. Patient phone number is [phone]. Please call back if not able to contact first.

## 2023-08-06 ENCOUNTER — Other Ambulatory Visit (HOSPITAL_COMMUNITY): Payer: Self-pay

## 2023-08-06 NOTE — Telephone Encounter (Signed)
 Pt returned phone call and we discussed. She states that she would not be comfortable paying for the MPPP and has apparently already been in touch with someone from [facility] regarding enrollment into PAP. She has been advised that she is under the financial threshold requirement and has been told that she will qualify. Advised pt bring in her copy of her portion of the enrollment form and we will give her a sample pen at that time. Also requested that she ask the front desk staff to scan in an image of her [facility] Medicare card as well.  She states that she will come by the clinic either this afternoon or (depending on the weather) on Thursday.

## 2023-08-06 NOTE — Telephone Encounter (Signed)
 Received notification from [facility] regarding a prior authorization for DUPIXENT. Authorization has been APPROVED from 08/01/2023 to 01/29/2024. Approval letter sent to scan center.  Per test claim, copay for 28 days supply is $1,381.28  Patient can fill through [facility]: [phone]   Authorization # [id number]  Attempted to contact pt to discuss, left VoiceMail requesting a return call. Direct office number provided.

## 2023-08-06 NOTE — Telephone Encounter (Signed)
 Prescriber form for [employer] patient assistance application placed in Dr. Shirlee More mailbox for signature

## 2023-08-06 NOTE — Telephone Encounter (Signed)
 Patient needs Dupixent by Tuesday the 26th of February. She is wondering if she can come by the office and get a shot since she is in between insurances right now for  no cost. Please call and advise. [phone]

## 2023-08-08 NOTE — Telephone Encounter (Signed)
 Received patient portion of [employer] form. Faxed to pulm pharmacy [facility] for submission once MD form returned  Patient's husband provided with one pen

## 2023-08-13 NOTE — Telephone Encounter (Signed)
 Received signed provider form.   Submitted Patient Assistance Application to [employer] for DUPIXENT along with provider portion, patient portion, PA, medication list, insurance card copy. Will update patient when we receive a response.  Phone #: [phone] Fax #: [phone]

## 2023-08-16 MED ORDER — DUPIXENT 300 MG/2ML ~~LOC~~ SOAJ: 300.0000 mg | SUBCUTANEOUS | 1 refills | Status: DC

## 2023-08-16 NOTE — Telephone Encounter (Signed)
 Received fax from [employer] requesting new prescription  Chesley Mires, PharmD, MPH, BCPS, CPP Clinical Pharmacist (Rheumatology and Pulmonology)

## 2023-08-19 DIAGNOSIS — M71342 Other bursal cyst, left hand: Secondary | ICD-10-CM | POA: Diagnosis not present

## 2023-08-19 DIAGNOSIS — L821 Other seborrheic keratosis: Secondary | ICD-10-CM | POA: Diagnosis not present

## 2023-08-19 DIAGNOSIS — L2089 Other atopic dermatitis: Secondary | ICD-10-CM | POA: Diagnosis not present

## 2023-08-19 DIAGNOSIS — L814 Other melanin hyperpigmentation: Secondary | ICD-10-CM | POA: Diagnosis not present

## 2023-08-19 DIAGNOSIS — D225 Melanocytic nevi of trunk: Secondary | ICD-10-CM | POA: Diagnosis not present

## 2023-08-20 NOTE — Telephone Encounter (Signed)
 Received a fax from  [employer] regarding an approval for DUPIXENT patient assistance from 08/20/2023 to 06/17/2024. Approval letter sent to scan center.  Phone #: [phone] Fax #: [phone]  Called patient to advise of approval through PAP. She will call insurance to dis-enroll from [facility] Payment Plan   Chesley Mires, PharmD, MPH, BCPS, CPP Clinical Pharmacist (Rheumatology and Pulmonology)

## 2023-10-10 DIAGNOSIS — M542 Cervicalgia: Secondary | ICD-10-CM | POA: Diagnosis not present

## 2023-10-10 DIAGNOSIS — M1991 Primary osteoarthritis, unspecified site: Secondary | ICD-10-CM | POA: Diagnosis not present

## 2023-10-10 DIAGNOSIS — M25562 Pain in left knee: Secondary | ICD-10-CM | POA: Diagnosis not present

## 2023-10-10 DIAGNOSIS — M064 Inflammatory polyarthropathy: Secondary | ICD-10-CM | POA: Diagnosis not present

## 2023-10-10 DIAGNOSIS — M79644 Pain in right finger(s): Secondary | ICD-10-CM | POA: Diagnosis not present

## 2023-10-10 DIAGNOSIS — R21 Rash and other nonspecific skin eruption: Secondary | ICD-10-CM | POA: Diagnosis not present

## 2023-10-10 DIAGNOSIS — E559 Vitamin D deficiency, unspecified: Secondary | ICD-10-CM | POA: Diagnosis not present

## 2023-11-14 DIAGNOSIS — M79642 Pain in left hand: Secondary | ICD-10-CM | POA: Diagnosis not present

## 2023-11-14 DIAGNOSIS — L089 Local infection of the skin and subcutaneous tissue, unspecified: Secondary | ICD-10-CM | POA: Diagnosis not present

## 2023-11-14 DIAGNOSIS — S60423A Blister (nonthermal) of left middle finger, initial encounter: Secondary | ICD-10-CM | POA: Diagnosis not present

## 2023-11-14 DIAGNOSIS — M67449 Ganglion, unspecified hand: Secondary | ICD-10-CM | POA: Diagnosis not present

## 2023-12-02 ENCOUNTER — Encounter: Payer: Self-pay | Admitting: Internal Medicine

## 2023-12-02 ENCOUNTER — Ambulatory Visit: Payer: Medicare Other | Admitting: Internal Medicine

## 2023-12-02 VITALS — BP 118/66 | HR 69 | Ht 62.5 in | Wt 188.6 lb

## 2023-12-02 DIAGNOSIS — L209 Atopic dermatitis, unspecified: Secondary | ICD-10-CM

## 2023-12-02 DIAGNOSIS — J4540 Moderate persistent asthma, uncomplicated: Secondary | ICD-10-CM

## 2023-12-02 DIAGNOSIS — D721 Eosinophilia, unspecified: Secondary | ICD-10-CM

## 2023-12-02 DIAGNOSIS — J45991 Cough variant asthma: Secondary | ICD-10-CM

## 2023-12-02 DIAGNOSIS — F17210 Nicotine dependence, cigarettes, uncomplicated: Secondary | ICD-10-CM | POA: Diagnosis not present

## 2023-12-02 DIAGNOSIS — R768 Other specified abnormal immunological findings in serum: Secondary | ICD-10-CM

## 2023-12-02 NOTE — Patient Instructions (Signed)
 ICD-10-CM   1. Moderate persistent asthma, uncomplicated  J45.40     2. Cough variant asthma  J45.991     3. History of chronic eczema  Z87.2     4. Eosinophilia, unspecified type  D72.10     5. Elevated IgE level  R76.8      Significant improvement in symptoms including eczema after starting Dupixent  September 2024.  I am glad for you. AT this visit 12/02/2023 - doing well  Plan - - -continue Dupixent  as scheduled -Continue Flonase , Advair/Wixela -Continue albuterol  as needed. - Foir Vitamin D - consider replesta as the best d3 supplement  Follow-up - 9months or sooner if needed' RSI cough score at followup  -Will check CBC with differential at follow-up  =-Cough score at follow-up
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
## 2023-12-11 ENCOUNTER — Telehealth: Payer: Self-pay | Admitting: Internal Medicine

## 2023-12-11 NOTE — Addendum Note (Signed)
 Addended by: CLAUDENE NEVINS A on: 12/11/2023 04:45 PM   Modules accepted: Orders

## 2023-12-11 NOTE — Telephone Encounter (Addendum)
 Lab has been ordered.  Pt is aware and stated that she would be by around 9:00 tomorrow tohave labs drawn. Appt has been scheduled.  Nothing further needed

## 2023-12-11 NOTE — Telephone Encounter (Signed)
 PT has blood work ordered for [facility]. Please change to our lab here. OK and correct per lab lady, Debbie.

## 2023-12-12 ENCOUNTER — Telehealth: Payer: Self-pay | Admitting: *Deleted

## 2023-12-12 ENCOUNTER — Other Ambulatory Visit

## 2023-12-12 ENCOUNTER — Other Ambulatory Visit: Payer: Self-pay | Admitting: *Deleted

## 2023-12-12 DIAGNOSIS — D721 Eosinophilia, unspecified: Secondary | ICD-10-CM

## 2023-12-12 DIAGNOSIS — R768 Other specified abnormal immunological findings in serum: Secondary | ICD-10-CM | POA: Diagnosis not present

## 2023-12-12 DIAGNOSIS — J45991 Cough variant asthma: Secondary | ICD-10-CM

## 2023-12-12 DIAGNOSIS — J4540 Moderate persistent asthma, uncomplicated: Secondary | ICD-10-CM | POA: Diagnosis not present

## 2023-12-12 DIAGNOSIS — L209 Atopic dermatitis, unspecified: Secondary | ICD-10-CM

## 2023-12-12 LAB — CORTISOL: Cortisol, Plasma: 6.2 ug/dL

## 2023-12-12 NOTE — Telephone Encounter (Signed)
 Patient came in to have her lab work drawn, she was requesting to have her cortisol level drawn.  She said she was having a lot of pain in her hips and he tracks her inflammation markers.  She wanted to make sure there would be no additional cost to her.  I advised her that would be between her and her Insurance, we would draw the blood and code it based on her diagnosis with our office.  The insurance pays based on the coding.  I spoke with Dr. Geronimo and he was fine with having the cortisol level drawn while she was here.  I put in the lab and made Debbie in the lab aware of the additional lab to be drawn.  The patient went to the lab to have her blood drawn.  Nothing further needed.

## 2023-12-13 LAB — CBC WITH DIFFERENTIAL/PLATELET
Basophils Absolute: 0.1 10*3/uL (ref 0.0–0.2)
Basos: 1 %
EOS (ABSOLUTE): 0.3 10*3/uL (ref 0.0–0.4)
Eos: 4 %
Hematocrit: 43.1 % (ref 34.0–46.6)
Hemoglobin: 13.8 g/dL (ref 11.1–15.9)
Immature Grans (Abs): 0 10*3/uL (ref 0.0–0.1)
Immature Granulocytes: 0 %
Lymphocytes Absolute: 1.1 10*3/uL (ref 0.7–3.1)
Lymphs: 18 %
MCH: 30.6 pg (ref 26.6–33.0)
MCHC: 32 g/dL (ref 31.5–35.7)
MCV: 96 fL (ref 79–97)
Monocytes Absolute: 0.5 10*3/uL (ref 0.1–0.9)
Monocytes: 8 %
Neutrophils Absolute: 4.3 10*3/uL (ref 1.4–7.0)
Neutrophils: 68 %
Platelets: 225 10*3/uL (ref 150–450)
RBC: 4.51 x10E6/uL (ref 3.77–5.28)
RDW: 13.1 % (ref 11.7–15.4)
WBC: 6.3 10*3/uL (ref 3.4–10.8)

## 2024-02-14 ENCOUNTER — Other Ambulatory Visit: Payer: Self-pay | Admitting: Pharmacist

## 2024-02-14 DIAGNOSIS — D721 Eosinophilia, unspecified: Secondary | ICD-10-CM

## 2024-02-14 DIAGNOSIS — L209 Atopic dermatitis, unspecified: Secondary | ICD-10-CM

## 2024-02-14 DIAGNOSIS — J4540 Moderate persistent asthma, uncomplicated: Secondary | ICD-10-CM

## 2024-02-14 MED ORDER — DUPIXENT 300 MG/2ML ~~LOC~~ SOAJ: 300.0000 mg | SUBCUTANEOUS | 1 refills | Status: AC

## 2024-02-14 NOTE — Telephone Encounter (Signed)
 Refill sent for DUPIXENT  to [facility]: [phone]  Dose: 300mg  subcut every 14 days  Last OV: 12/02/2023 Provider: Dr. Geronimo  Next OV: due March 2026  Cassidy Walton, PharmD, BCPS Clinical Pharmacist  [facility]

## 2024-02-21 ENCOUNTER — Other Ambulatory Visit: Payer: Self-pay | Admitting: Internal Medicine

## 2024-02-21 NOTE — Telephone Encounter (Signed)
 Copied from CRM [id number]. Topic: Clinical - Medication Refill >> Feb 21, 2024  3:24 PM Isabell A wrote: Medication: fluticasone -salmeterol (WIXELA INHUB) 250-50 MCG/ACT AEPB  Has the patient contacted their pharmacy? Yes (Agent: If no, request that the patient contact the pharmacy for the refill. If patient does not wish to contact the pharmacy document the reason why and proceed with request.) (Agent: If yes, when and what did the pharmacy advise?)  This is the patient's preferred pharmacy:  [facility] [place], [place] - [address] [place] [address] Phone: [phone] Fax: [phone]    Is this the correct pharmacy for this prescription? Yes If no, delete pharmacy and type the correct one.   Has the prescription been filled recently? Yes  Is the patient out of the medication? Yes  Has the patient been seen for an appointment in the last year OR does the patient have an upcoming appointment? Yes  Can we respond through MyChart? No  Agent: Please be advised that Rx refills may take up to 3 business days. We ask that you follow-up with your pharmacy.

## 2024-02-24 MED ORDER — FLUTICASONE-SALMETEROL 250-50 MCG/ACT IN AEPB: 1.0000 | INHALATION_SPRAY | Freq: Two times a day (BID) | RESPIRATORY_TRACT | 11 refills | Status: AC

## 2024-03-25 DIAGNOSIS — R21 Rash and other nonspecific skin eruption: Secondary | ICD-10-CM | POA: Diagnosis not present

## 2024-03-25 DIAGNOSIS — M064 Inflammatory polyarthropathy: Secondary | ICD-10-CM | POA: Diagnosis not present

## 2024-03-25 DIAGNOSIS — M79644 Pain in right finger(s): Secondary | ICD-10-CM | POA: Diagnosis not present

## 2024-03-25 DIAGNOSIS — E559 Vitamin D deficiency, unspecified: Secondary | ICD-10-CM | POA: Diagnosis not present

## 2024-03-25 DIAGNOSIS — M1991 Primary osteoarthritis, unspecified site: Secondary | ICD-10-CM | POA: Diagnosis not present

## 2024-03-25 DIAGNOSIS — M25562 Pain in left knee: Secondary | ICD-10-CM | POA: Diagnosis not present

## 2024-03-25 DIAGNOSIS — M542 Cervicalgia: Secondary | ICD-10-CM | POA: Diagnosis not present

## 2024-06-13 ENCOUNTER — Ambulatory Visit: Payer: Self-pay | Admitting: Internal Medicine

## 2024-07-17 ENCOUNTER — Telehealth: Payer: Self-pay

## 2024-07-17 NOTE — Telephone Encounter (Signed)
 Copied from CRM [id number]. Topic: Clinical - Medication Question >> Jul 15, 2024  1:02 PM LaVerne A wrote: Reason for CRM: Patient needs for Dr. Charis to fill out a enrollment/re-enrollment form for Dupilumab  (DUPIXENT ) 300 MG/2ML SOAJ.  This is for the 2026 prescription.  The fax number [phone]. Please text her at [phone] to let her know.  Thanks.   Routing to pharmacy.

## 2024-07-20 NOTE — Telephone Encounter (Signed)
 Received fax from Dupixent  [employer] stating pt was approved for PAP until 06/17/25. Letter scanned to media tab for retention.
